# Patient Record
Sex: Female | Born: 1970 | Race: Black or African American | Hispanic: No | Marital: Single | Smoking: Current every day smoker
Health system: Southern US, Community
[De-identification: ages and names within clinical notes are randomized; demographics above are authoritative.]

## PROBLEM LIST (undated history)

## (undated) DIAGNOSIS — F329 Major depressive disorder, single episode, unspecified: Principal | ICD-10-CM

## (undated) DIAGNOSIS — G4733 Obstructive sleep apnea (adult) (pediatric): Secondary | ICD-10-CM

## (undated) HISTORY — PX: FOOT SURGERY: SHX648

## (undated) HISTORY — DX: Major depressive disorder, single episode, unspecified: F32.9

## (undated) HISTORY — DX: Obstructive sleep apnea (adult) (pediatric): G47.33

## (undated) HISTORY — PX: APPENDECTOMY: SHX54

---

## 2001-06-21 ENCOUNTER — Emergency Department (HOSPITAL_COMMUNITY): Admission: EM | Admit: 2001-06-21 | Discharge: 2001-06-21 | Payer: Self-pay

## 2002-04-30 ENCOUNTER — Encounter: Payer: Self-pay | Admitting: Orthopedic Surgery

## 2002-04-30 ENCOUNTER — Inpatient Hospital Stay (HOSPITAL_COMMUNITY): Admission: RE | Admit: 2002-04-30 | Discharge: 2002-05-01 | Payer: Self-pay | Admitting: Orthopedic Surgery

## 2003-06-02 ENCOUNTER — Emergency Department (HOSPITAL_COMMUNITY): Admission: EM | Admit: 2003-06-02 | Discharge: 2003-06-02 | Payer: Self-pay | Admitting: *Deleted

## 2003-06-02 ENCOUNTER — Encounter: Payer: Self-pay | Admitting: *Deleted

## 2003-10-15 ENCOUNTER — Emergency Department (HOSPITAL_COMMUNITY): Admission: AD | Admit: 2003-10-15 | Discharge: 2003-10-15 | Payer: Self-pay | Admitting: Family Medicine

## 2004-02-06 ENCOUNTER — Emergency Department (HOSPITAL_COMMUNITY): Admission: EM | Admit: 2004-02-06 | Discharge: 2004-02-07 | Payer: Self-pay | Admitting: Emergency Medicine

## 2004-10-13 ENCOUNTER — Emergency Department (HOSPITAL_COMMUNITY): Admission: EM | Admit: 2004-10-13 | Discharge: 2004-10-13 | Payer: Self-pay | Admitting: Family Medicine

## 2004-10-21 ENCOUNTER — Encounter: Admission: RE | Admit: 2004-10-21 | Discharge: 2004-10-21 | Payer: Self-pay | Admitting: Family Medicine

## 2004-10-23 ENCOUNTER — Encounter: Admission: RE | Admit: 2004-10-23 | Discharge: 2004-10-23 | Payer: Self-pay | Admitting: Family Medicine

## 2004-12-29 ENCOUNTER — Ambulatory Visit (HOSPITAL_COMMUNITY): Admission: RE | Admit: 2004-12-29 | Discharge: 2004-12-29 | Payer: Self-pay | Admitting: Neurology

## 2006-02-06 ENCOUNTER — Emergency Department (HOSPITAL_COMMUNITY): Admission: EM | Admit: 2006-02-06 | Discharge: 2006-02-06 | Payer: Self-pay | Admitting: Emergency Medicine

## 2007-05-23 ENCOUNTER — Emergency Department (HOSPITAL_COMMUNITY): Admission: EM | Admit: 2007-05-23 | Discharge: 2007-05-23 | Payer: Self-pay | Admitting: Family Medicine

## 2007-05-28 ENCOUNTER — Emergency Department (HOSPITAL_COMMUNITY): Admission: EM | Admit: 2007-05-28 | Discharge: 2007-05-28 | Payer: Self-pay | Admitting: Family Medicine

## 2008-01-01 ENCOUNTER — Emergency Department (HOSPITAL_COMMUNITY): Admission: EM | Admit: 2008-01-01 | Discharge: 2008-01-01 | Payer: Self-pay | Admitting: Emergency Medicine

## 2008-12-09 ENCOUNTER — Ambulatory Visit: Payer: Self-pay | Admitting: Internal Medicine

## 2011-05-04 LAB — POCT CARDIAC MARKERS
Myoglobin, poc: 46.1
Operator id: 234501
Troponin i, poc: <0.05

## 2011-05-04 LAB — DIFFERENTIAL
Eosinophils Absolute: 0.2
Lymphocytes Relative: 25
Lymphs Abs: 1.9
Monocytes Relative: 6
Neutro Abs: 5.1

## 2011-05-04 LAB — CBC
HCT: 35.3 — ABNORMAL LOW
Hemoglobin: 12.1
MCHC: 34.1
MCV: 92.5
RBC: 3.82 — ABNORMAL LOW
WBC: 7.7

## 2011-05-04 LAB — POCT I-STAT, CHEM 8
Creatinine, Ser: 0.9
HCT: 38

## 2013-04-29 ENCOUNTER — Emergency Department (HOSPITAL_COMMUNITY): Payer: 59

## 2013-04-29 ENCOUNTER — Emergency Department (HOSPITAL_COMMUNITY)
Admission: EM | Admit: 2013-04-29 | Discharge: 2013-04-29 | Disposition: A | Discharge status: Home / Self Care | Payer: 59 | Attending: Emergency Medicine | Admitting: Emergency Medicine

## 2013-04-29 ENCOUNTER — Encounter (HOSPITAL_COMMUNITY): Payer: Self-pay

## 2013-04-29 ENCOUNTER — Encounter (HOSPITAL_COMMUNITY): Payer: Self-pay | Admitting: Emergency Medicine

## 2013-04-29 ENCOUNTER — Emergency Department (HOSPITAL_COMMUNITY)
Admission: EM | Admit: 2013-04-29 | Discharge: 2013-04-29 | Disposition: A | Discharge status: Other Acute Inpatient Hospital | Payer: 59 | Source: Home / Self Care | Attending: Family Medicine | Admitting: Family Medicine

## 2013-04-29 DIAGNOSIS — R5381 Other malaise: Secondary | ICD-10-CM | POA: Insufficient documentation

## 2013-04-29 DIAGNOSIS — R600 Localized edema: Secondary | ICD-10-CM

## 2013-04-29 DIAGNOSIS — R609 Edema, unspecified: Secondary | ICD-10-CM

## 2013-04-29 DIAGNOSIS — M79609 Pain in unspecified limb: Secondary | ICD-10-CM | POA: Insufficient documentation

## 2013-04-29 DIAGNOSIS — R12 Heartburn: Secondary | ICD-10-CM | POA: Insufficient documentation

## 2013-04-29 DIAGNOSIS — R0789 Other chest pain: Secondary | ICD-10-CM

## 2013-04-29 DIAGNOSIS — Z79899 Other long term (current) drug therapy: Secondary | ICD-10-CM | POA: Insufficient documentation

## 2013-04-29 DIAGNOSIS — M7989 Other specified soft tissue disorders: Secondary | ICD-10-CM | POA: Insufficient documentation

## 2013-04-29 DIAGNOSIS — F17200 Nicotine dependence, unspecified, uncomplicated: Secondary | ICD-10-CM | POA: Insufficient documentation

## 2013-04-29 DIAGNOSIS — R079 Chest pain, unspecified: Secondary | ICD-10-CM | POA: Insufficient documentation

## 2013-04-29 DIAGNOSIS — M79605 Pain in left leg: Secondary | ICD-10-CM

## 2013-04-29 LAB — POCT I-STAT, CHEM 8
BUN: 7 mg/dL (ref 6–23)
Glucose, Bld: 91 mg/dL (ref 70–99)
Potassium: 4 mEq/L (ref 3.5–5.1)
Sodium: 138 mEq/L (ref 135–145)
TCO2: 23 mmol/L (ref 0–100)

## 2013-04-29 LAB — CBC WITH DIFFERENTIAL/PLATELET
Basophils Absolute: 0 10*3/uL (ref 0.0–0.1)
Lymphs Abs: 2.7 10*3/uL (ref 0.7–4.0)
Monocytes Absolute: 0.5 10*3/uL (ref 0.1–1.0)
Monocytes Relative: 5 % (ref 3–12)
Neutro Abs: 7.2 10*3/uL (ref 1.7–7.7)
Neutrophils Relative %: 68 % (ref 43–77)
WBC: 10.6 10*3/uL — ABNORMAL HIGH (ref 4.0–10.5)

## 2013-04-29 LAB — POCT I-STAT TROPONIN I: Troponin i, poc: 0.01 ng/mL (ref 0.00–0.08)

## 2013-04-29 MED ORDER — IOHEXOL 350 MG/ML SOLN
100.0000 mL | Freq: Once | INTRAVENOUS | Status: AC | PRN
  Administered 2013-04-29: 100 mL via INTRAVENOUS

## 2013-04-29 MED ORDER — MORPHINE SULFATE 4 MG/ML IJ SOLN
4.0000 mg | Freq: Once | INTRAMUSCULAR | Status: AC
  Administered 2013-04-29: 4 mg via INTRAVENOUS
  Filled 2013-04-29: qty 1

## 2013-04-29 MED ORDER — GI COCKTAIL ~~LOC~~
30.0000 mL | Freq: Once | ORAL | Status: AC
  Administered 2013-04-29: 30 mL via ORAL

## 2013-04-29 MED ORDER — SODIUM CHLORIDE 0.9 % IV SOLN
Freq: Once | INTRAVENOUS | Status: AC
  Administered 2013-04-29: 16:00:00 via INTRAVENOUS

## 2013-04-29 MED ORDER — ESOMEPRAZOLE MAGNESIUM 40 MG PO CPDR: 40.0000 mg | DELAYED_RELEASE_CAPSULE | Freq: Every day | ORAL | Status: DC

## 2013-04-29 MED ORDER — NITROGLYCERIN 0.4 MG SL SUBL
SUBLINGUAL_TABLET | SUBLINGUAL | Status: AC
  Filled 2013-04-29: qty 25

## 2013-04-29 MED ORDER — PANTOPRAZOLE SODIUM 40 MG IV SOLR
40.0000 mg | Freq: Once | INTRAVENOUS | Status: AC
Start: 2013-04-29 — End: 2013-04-29
  Administered 2013-04-29: 40 mg via INTRAVENOUS
  Filled 2013-04-29: qty 40

## 2013-04-29 MED ORDER — ONDANSETRON HCL 4 MG/2ML IJ SOLN
4.0000 mg | Freq: Once | INTRAMUSCULAR | Status: AC
  Administered 2013-04-29: 4 mg via INTRAVENOUS
  Filled 2013-04-29: qty 2

## 2013-04-29 MED ORDER — GI COCKTAIL ~~LOC~~
ORAL | Status: AC
  Filled 2013-04-29: qty 30

## 2013-04-29 MED ORDER — NITROGLYCERIN 0.4 MG SL SUBL
0.4000 mg | SUBLINGUAL_TABLET | SUBLINGUAL | Status: DC | PRN
  Administered 2013-04-29: 0.4 mg via SUBLINGUAL

## 2013-04-29 MED ORDER — OXYCODONE-ACETAMINOPHEN 5-325 MG PO TABS: 2.0000 | ORAL_TABLET | ORAL | Status: DC | PRN

## 2013-04-29 NOTE — ED Notes (Signed)
Works as Print production planner, sits most of the day. C/o recent swelling both legs,pitting edema noted both legs to knee,  chest pain and heaviness ( feels like there is a weight on her chest ) No PCP, no regular medicaitions

## 2013-04-29 NOTE — Progress Notes (Signed)
*  Preliminary Results* Left lower extremity venous duplex completed. Left lower extremity is negative for deep vein thrombosis. There is no evidence of left Baker's cyst.  Preliminary results discussed with Dr.Belfi.  04/29/2013 8:01 PM  Gertie Fey, RVT, RDCS, RDMS

## 2013-04-29 NOTE — ED Provider Notes (Signed)
CSN: [id number]     Arrival date & time 04/29/13  1652 History   First MD Initiated Contact with Patient 04/29/13 1700     Chief Complaint  Patient presents with  . Chest Pain   (Consider location/radiation/quality/duration/timing/severity/associated sxs/prior Treatment) HPI Comments: Patient presents with chest pain. She presented to the urgent care Center earlier this afternoon with complaints of leg swelling. She woke up this morning with some swelling of her left leg which is new for her. She also has some aching discomfort in her posterior calf and left thigh. She states this started this morning as well. She states she drove to [place] over the weekend. She denies any history of blood clots in the past. She states she also but this morning with some discomfort in her mid chest. Otherwise nonradiating. She has some heartburn symptoms as well. She has had some fatigue throughout the day. She denies any shortness of breath. She denies any pleuritic-type chest pain. She denies any history of heart problems in the past or any known past medical history. She denies a history of early heart disease in her family. She denies a history of smoking.  Patient is a 42 y.o. female presenting with chest pain.  Chest Pain Associated symptoms: fatigue   Associated symptoms: no abdominal pain, no back pain, no cough, no diaphoresis, no dizziness, no fever, no headache, no nausea, no numbness, no shortness of breath, not vomiting and no weakness     History reviewed. No pertinent past medical history. History reviewed. No pertinent past surgical history. No family history on file. History  Substance Use Topics  . Smoking status: Never Smoker   . Smokeless tobacco: Not on file  . Alcohol Use: No   OB History   Grav Para Term Preterm Abortions TAB SAB Ect Mult Living                 Review of Systems  Constitutional: Positive for fatigue. Negative for fever, chills and diaphoresis.  HENT: Negative for  congestion, rhinorrhea and sneezing.   Eyes: Negative.   Respiratory: Negative for cough, chest tightness and shortness of breath.   Cardiovascular: Positive for chest pain and leg swelling.  Gastrointestinal: Negative for nausea, vomiting, abdominal pain, diarrhea and blood in stool.  Genitourinary: Negative for frequency, hematuria, flank pain and difficulty urinating.  Musculoskeletal: Negative for back pain and arthralgias.  Skin: Negative for rash.  Neurological: Negative for dizziness, speech difficulty, weakness, numbness and headaches.    Allergies  Review of patient's allergies indicates no known allergies.  Home Medications   Current Outpatient Rx  Name  Route  Sig  Dispense  Refill  . esomeprazole (NEXIUM) 40 MG capsule   Oral   Take 1 capsule (40 mg total) by mouth daily.   30 capsule   0   . oxyCODONE-acetaminophen (PERCOCET) 5-325 MG per tablet   Oral   Take 2 tablets by mouth every 4 (four) hours as needed for pain.   20 tablet   0    BP 117/68  Temp(Src) 99 F (37.2 C) (Oral)  Resp 18  SpO2 100%  LMP 04/15/2013 Physical Exam  Constitutional: She is oriented to person, place, and time. She appears well-developed and well-nourished.  HENT:  Head: Normocephalic and atraumatic.  Mouth/Throat: Oropharynx is clear and moist.  Eyes: Pupils are equal, round, and reactive to light.  Neck: Normal range of motion. Neck supple.  Cardiovascular: Normal rate, regular rhythm and normal heart sounds.  Pulmonary/Chest: Effort normal and breath sounds normal. No respiratory distress. She has no wheezes. She has no rales. She exhibits tenderness (Positive reproducible tenderness over the sternum).  Abdominal: Soft. Bowel sounds are normal. There is no tenderness. There is no rebound and no guarding.  Musculoskeletal: Normal range of motion. She exhibits edema and tenderness.  Patient has some mild pain edema to the right lower leg which patient says is at baseline. She  also has some mild pitting edema to the left lower legs up to the knee which she says is new since this morning. She also has some mild tenderness along her posterior calf. Pedal pulses are intact and symmetric  Lymphadenopathy:    She has no cervical adenopathy.  Neurological: She is alert and oriented to person, place, and time.  Skin: Skin is warm and dry. No rash noted.  Psychiatric: She has a normal mood and affect.    ED Course  Procedures (including critical care time) Labs Review Labs Reviewed  CBC WITH DIFFERENTIAL - Abnormal; Notable for the following:    WBC 10.6 (*)    All other components within normal limits  POCT I-STAT TROPONIN I   Imaging Review Ct Angio Chest Pe W/cm &/or Wo Cm  04/29/2013   CLINICAL DATA:  Leg swelling. Chest pain. Chest heaviness. Pulmonary embolism.  EXAM: CT ANGIOGRAPHY CHEST WITH CONTRAST  TECHNIQUE: Multidetector CT imaging of the chest was performed using the standard protocol during bolus administration of intravenous contrast. Multiplanar CT image reconstructions including MIPs were obtained to evaluate the vascular anatomy.  CONTRAST:  OMNIPAQUE IOHEXOL 350 MG/ML SOLN  COMPARISON:  01/01/2008.  FINDINGS: There is bolus dispersion on the examination. The study is adequate for evaluation of pulmonary embolus and no pulmonary embolism is present. Prominent residual thymic tissue is present in the anterior mediastinum which may represent thymic hyperplasia. Aorta and branch vessels appear within normal limits. No effusion. Heart grossly appears within normal limits. Incidental imaging of the upper abdomen is within normal limits. No adenopathy. Bones appear within normal limits. Scattered areas of ground-glass attenuation are present in the lung, most compatible with air trapping. Central airways are patent.  Review of the MIP images confirms the above findings.  IMPRESSION: Technically adequate study without pulmonary embolism. No acute  cardiopulmonary disease.   Electronically Signed   By: Andreas Newport M.D.   On: 04/29/2013 18:35     Date: 04/29/2013  Rate: 69  Rhythm: normal sinus rhythm  QRS Axis: normal  Intervals: normal  ST/T Wave abnormalities: nonspecific T wave changes  Conduction Disutrbances:none  Narrative Interpretation:   Old EKG Reviewed: unchanged  From EKG on same day, no prior EKGs   MDM   1. Chest pain   2. Leg pain, left    Patient presents with chest pain and left leg pain. She does have some symptoms suggestive of reflux with complaints of burning up into her chest. Chills he describes as a tightness. She got no relief after a GI cocktail and nitroglycerin which was given prior to arrival. She has no evidence of pulmonary embolus or left leg DVT. She has no evidence of pulmonary edema. Her EKG did not show any significant ischemic changes and her troponins negative. Her pain is also somewhat reproducible on exam with palpation. Her pain had started on waking up this morning. I have a low suspicion of acute coronary syndrome. She has no other known risk factors for early heart disease. She's currently chest pain-free. She  did seem to get better after a dose of morphine and some Protonix. I feel this point she can be discharged with close followup. [facility] and I advised her: The morning to be seen within the next couple of days. I advised to return here if she has worsening chest pain shortness of breath or other worsening symptoms.    Rolan Bucco, MD 04/29/13 2013

## 2013-04-29 NOTE — ED Notes (Signed)
Patient states she has had no change in her discomfort since her GI coctail was given, Dr Artis Flock aware; CN Martie Lee advised of pending transfer via [facility]; [facility] called to transport ASAP to main ED

## 2013-04-29 NOTE — ED Notes (Signed)
Pt brought to ED by [facility] from urgent care with chest pain radiating to back  And no SOB.pt recived nitroX1 by EMS but still in pain and asiprin???not given.

## 2013-04-29 NOTE — ED Provider Notes (Signed)
CSN: [id number]     Arrival date & time 04/29/13  1348 History   First MD Initiated Contact with Patient 04/29/13 1415     Chief Complaint  Patient presents with  . Weakness   (Consider location/radiation/quality/duration/timing/severity/associated sxs/prior Treatment) Patient is a 42 y.o. female presenting with weakness. The history is provided by the patient.  Weakness This is a new problem. The current episode started 6 to 12 hours ago. The problem has been gradually worsening. Associated symptoms include chest pain. Pertinent negatives include no shortness of breath. Associated symptoms comments: Burning like fire in chest, no h/o gerd, ankles swelling this am, no n/v. Feels like shirt is getting tighter.    History reviewed. No pertinent past medical history. History reviewed. No pertinent past surgical history. History reviewed. No pertinent family history. History  Substance Use Topics  . Smoking status: Never Smoker   . Smokeless tobacco: Not on file  . Alcohol Use: No   OB History   Grav Para Term Preterm Abortions TAB SAB Ect Mult Living                 Review of Systems  Respiratory: Positive for chest tightness. Negative for shortness of breath and wheezing.   Cardiovascular: Positive for chest pain and leg swelling. Negative for palpitations.  Neurological: Positive for weakness.    Allergies  Review of patient's allergies indicates no known allergies.  Home Medications   Current Outpatient Rx  Name  Route  Sig  Dispense  Refill  . esomeprazole (NEXIUM) 40 MG capsule   Oral   Take 1 capsule (40 mg total) by mouth daily.   30 capsule   0   . oxyCODONE-acetaminophen (PERCOCET) 5-325 MG per tablet   Oral   Take 2 tablets by mouth every 4 (four) hours as needed for pain.   20 tablet   0    BP 139/84  Pulse 76  Temp(Src) 98.6 F (37 C) (Oral)  Resp 18  SpO2 100%  LMP 04/15/2013 Physical Exam  Nursing note and vitals reviewed. Constitutional: She  is oriented to person, place, and time. She appears well-developed and well-nourished.  HENT:  Head: Normocephalic.  Mouth/Throat: Oropharynx is clear and moist.  Neck: Normal range of motion. Neck supple.  Cardiovascular: Normal rate, regular rhythm, normal heart sounds and intact distal pulses.   Pulmonary/Chest: Effort normal and breath sounds normal. She exhibits tenderness.  Abdominal: Soft. Normal appearance and bowel sounds are normal. She exhibits no distension and no mass. There is tenderness in the epigastric area. There is no rebound and no guarding.  Musculoskeletal: She exhibits edema.  Lymphadenopathy:    She has no cervical adenopathy.  Neurological: She is alert and oriented to person, place, and time.  Skin: Skin is warm and dry.    ED Course  Procedures (including critical care time) Labs Review Labs Reviewed  POCT I-STAT, CHEM 8   Imaging Review Ct Angio Chest Pe W/cm &/or Wo Cm  04/29/2013   CLINICAL DATA:  Leg swelling. Chest pain. Chest heaviness. Pulmonary embolism.  EXAM: CT ANGIOGRAPHY CHEST WITH CONTRAST  TECHNIQUE: Multidetector CT imaging of the chest was performed using the standard protocol during bolus administration of intravenous contrast. Multiplanar CT image reconstructions including MIPs were obtained to evaluate the vascular anatomy.  CONTRAST:  OMNIPAQUE IOHEXOL 350 MG/ML SOLN  COMPARISON:  01/01/2008.  FINDINGS: There is bolus dispersion on the examination. The study is adequate for evaluation of pulmonary embolus and no pulmonary  embolism is present. Prominent residual thymic tissue is present in the anterior mediastinum which may represent thymic hyperplasia. Aorta and branch vessels appear within normal limits. No effusion. Heart grossly appears within normal limits. Incidental imaging of the upper abdomen is within normal limits. No adenopathy. Bones appear within normal limits. Scattered areas of ground-glass attenuation are present in the  lung, most compatible with air trapping. Central airways are patent.  Review of the MIP images confirms the above findings.  IMPRESSION: Technically adequate study without pulmonary embolism. No acute cardiopulmonary disease.   Electronically Signed   By: Andreas Newport M.D.   On: 04/29/2013 18:35    MDM  ecg-- per dr cooper-no acute changes. No change in sx with gi cocktail  Sent for further eval of atypical chest pain, leg swelling and low back pain--all new sx with no prior hx.    Linna Hoff, MD 04/29/13 2122

## 2013-05-01 ENCOUNTER — Encounter (HOSPITAL_COMMUNITY): Payer: Self-pay | Admitting: Emergency Medicine

## 2013-05-01 ENCOUNTER — Emergency Department (HOSPITAL_COMMUNITY)
Admission: EM | Admit: 2013-05-01 | Discharge: 2013-05-01 | Disposition: A | Discharge status: Home / Self Care | Payer: 59 | Attending: Emergency Medicine | Admitting: Emergency Medicine

## 2013-05-01 DIAGNOSIS — T4995XA Adverse effect of unspecified topical agent, initial encounter: Secondary | ICD-10-CM | POA: Insufficient documentation

## 2013-05-01 DIAGNOSIS — T7840XA Allergy, unspecified, initial encounter: Secondary | ICD-10-CM | POA: Insufficient documentation

## 2013-05-01 MED ORDER — ACETAMINOPHEN 325 MG PO TABS
650.0000 mg | ORAL_TABLET | Freq: Once | ORAL | Status: AC
  Administered 2013-05-01: 650 mg via ORAL
  Filled 2013-05-01: qty 2

## 2013-05-01 MED ORDER — FAMOTIDINE 20 MG PO TABS
20.0000 mg | ORAL_TABLET | Freq: Once | ORAL | Status: AC
  Administered 2013-05-01: 20 mg via ORAL
  Filled 2013-05-01: qty 1

## 2013-05-01 MED ORDER — FAMOTIDINE 20 MG PO TABS: 20.0000 mg | ORAL_TABLET | Freq: Two times a day (BID) | ORAL | Status: DC

## 2013-05-01 MED ORDER — DIPHENHYDRAMINE HCL 25 MG PO CAPS
50.0000 mg | ORAL_CAPSULE | Freq: Once | ORAL | Status: AC
  Administered 2013-05-01: 50 mg via ORAL
  Filled 2013-05-01: qty 2

## 2013-05-01 MED ORDER — PREDNISONE 20 MG PO TABS
60.0000 mg | ORAL_TABLET | Freq: Once | ORAL | Status: AC
  Administered 2013-05-01: 60 mg via ORAL
  Filled 2013-05-01: qty 3

## 2013-05-01 MED ORDER — DIPHENHYDRAMINE HCL 25 MG PO TABS: 25.0000 mg | ORAL_TABLET | Freq: Four times a day (QID) | ORAL | Status: DC

## 2013-05-01 MED ORDER — EPINEPHRINE 0.3 MG/0.3ML IJ SOAJ
0.3000 mg | Freq: Once | INTRAMUSCULAR | Status: AC
  Administered 2013-05-01: 0.3 mg via SUBCUTANEOUS
  Filled 2013-05-01: qty 0.3

## 2013-05-01 NOTE — ED Provider Notes (Signed)
Medical screening examination/treatment/procedure(s) were conducted as a shared visit with non-physician practitioner(s) and myself.  I personally evaluated the patient during the encounter Patient presents with subjective facial swelling starting this morning. She she's been using oxycodone Nexium prescribed from her previous ED visit. She is no new exposures or medications. Patient has no oropharyngeal swelling. I do not appreciate any obvious facial swelling. Patient's lungs are clear no evidence of any stridor. She has been treated for possible allergic reaction though I think this is probably more related to seasonal allergies. She is observed in the emergency department without worsening symptoms.   Loren Racer, MD 05/01/13 [phone]

## 2013-05-01 NOTE — ED Notes (Signed)
PA at bedside.

## 2013-05-01 NOTE — ED Notes (Signed)
Pt from home, c/o generlized swelling. Pt was seen on Mon for bilateral feet edema. Pt states feet swelling decrease, states feeling tight all over.

## 2013-05-01 NOTE — ED Provider Notes (Signed)
CSN: [id number]     Arrival date & time 05/01/13  4098 History   First MD Initiated Contact with Patient 05/01/13 [phone]     Chief Complaint  Patient presents with  . Facial Swelling   (Consider location/radiation/quality/duration/timing/severity/associated sxs/prior Treatment) HPI  42 year old female with no significant past medical history presents for evaluations of facial edema. Patient states 2 days ago she was evaluated in the ER for left lower extremity edema. She had a Doppler ultrasound along with chest CT angiogram which showed no evidence of PE or DVT. She was discharged with oxycodone and Nexium. States she took medication as prescribed. This morning she woke up noticing sensation of facial swelling along with eyelids swelling, and mild rash, and a itchy and burning sensation throughout the back. Symptom has been persistent. No specific treatment tried. Denies any other environmental changes including no other recent medication change, soap, detergent, new pets. States she has had Nexium and Percocet in the past with no problem. Denies eating any other exotic foods or any recent travel. No history of asthma. Patient is a nonsmoker. Currently denies any fever, chills, tongue swelling, throat swelling, difficulty breathing, chest pain, shortness of breath, abdominal pain, nausea, vomiting, diarrhea, numbness or weakness although she does complaining of "feeling tight all over".  Denies any recent sickness.  History reviewed. No pertinent past medical history. Past Surgical History  Procedure Laterality Date  . Appendectomy     History reviewed. No pertinent family history. History  Substance Use Topics  . Smoking status: Never Smoker   . Smokeless tobacco: Not on file  . Alcohol Use: No   OB History   Grav Para Term Preterm Abortions TAB SAB Ect Mult Living                 Review of Systems  All other systems reviewed and are negative.    Allergies  Review of patient's  allergies indicates no known allergies.  Home Medications   Current Outpatient Rx  Name  Route  Sig  Dispense  Refill  . esomeprazole (NEXIUM) 40 MG capsule   Oral   Take 1 capsule (40 mg total) by mouth daily.   30 capsule   0   . oxyCODONE-acetaminophen (PERCOCET) 5-325 MG per tablet   Oral   Take 2 tablets by mouth every 4 (four) hours as needed for pain.   20 tablet   0    BP 123/93  Pulse 76  Temp(Src) 98.6 F (37 C) (Oral)  Resp 18  Ht 5\' 7"  (1.702 m)  Wt 170 lb (77.111 kg)  BMI 26.62 kg/m2  SpO2 100%  LMP 04/15/2013 Physical Exam  Nursing note and vitals reviewed. Constitutional: She is oriented to person, place, and time. She appears well-developed and well-nourished. No distress.  Awake, alert, nontoxic appearance  HENT:  Head: Atraumatic.  Mouth/Throat: Oropharynx is clear and moist.  No significant tongue swelling or airway compromise  Mild palbebral edema bilat without ptosis or vision changes.    Eyes: Conjunctivae are normal. Right eye exhibits no discharge. Left eye exhibits no discharge.  Neck: Neck supple.  Cardiovascular: Normal rate and regular rhythm.   Pulmonary/Chest: Effort normal. No respiratory distress. She exhibits no tenderness.  Abdominal: Soft. There is no tenderness. There is no rebound.  Musculoskeletal: She exhibits no tenderness.  ROM appears intact, no obvious focal weakness  Neurological: She is alert and oriented to person, place, and time.  Mental status and motor strength appears intact  Skin:  No rash (a fine faint maculopapular rash noted throughout face, upper chest, and BUE without petechiae/vesicular/pustular lesions.) noted.  Psychiatric: She has a normal mood and affect.    ED Course  Procedures (including critical care time)  9:32 AM Pt with sxs suggestive of allergic reaction.  NO airway compromised.  NO red flags rash.  Difficult to pin point the source of allergy.  Pt did recently have IV contrast dye, Percocet  and Nexium.  Will give prednisone/benadryl/pepcid combo and will monitor.    10:45 AM On recheck no worsening of symptoms. Patient state itchiness has improved. Request Tylenol for headache. Tylenol given.  11:22 AM On reevaluation pt report mild throat tightening sensation.  No wheezes on exam, sats 97% on RA, difficult to assess mouth as pt unable to open mouth fully, but sts this is normal for her.  Pt denies tongue swelling.  Care discussed with attending who evaluate pt and felt her airway is intact.  However, recommend epi 0.3mg  subQ.  Will continue to monitor.  Pt currently in NAD.    12:14 PM Patient has been monitored for more than 3 hours without any objective worsening of symptoms. She is currently stable for discharge. Return precautions discussed. Patient will followup with her PCP on Friday.  Labs Review Labs Reviewed - No data to display Imaging Review Ct Angio Chest Pe W/cm &/or Wo Cm  04/29/2013   CLINICAL DATA:  Leg swelling. Chest pain. Chest heaviness. Pulmonary embolism.  EXAM: CT ANGIOGRAPHY CHEST WITH CONTRAST  TECHNIQUE: Multidetector CT imaging of the chest was performed using the standard protocol during bolus administration of intravenous contrast. Multiplanar CT image reconstructions including MIPs were obtained to evaluate the vascular anatomy.  CONTRAST:  OMNIPAQUE IOHEXOL 350 MG/ML SOLN  COMPARISON:  01/01/2008.  FINDINGS: There is bolus dispersion on the examination. The study is adequate for evaluation of pulmonary embolus and no pulmonary embolism is present. Prominent residual thymic tissue is present in the anterior mediastinum which may represent thymic hyperplasia. Aorta and branch vessels appear within normal limits. No effusion. Heart grossly appears within normal limits. Incidental imaging of the upper abdomen is within normal limits. No adenopathy. Bones appear within normal limits. Scattered areas of ground-glass attenuation are present in the lung,  most compatible with air trapping. Central airways are patent.  Review of the MIP images confirms the above findings.  IMPRESSION: Technically adequate study without pulmonary embolism. No acute cardiopulmonary disease.   Electronically Signed   By: Andreas Newport M.D.   On: 04/29/2013 18:35    MDM   1. Allergic reaction, initial encounter    BP 123/93  Pulse 76  Temp(Src) 98.6 F (37 C) (Oral)  Resp 18  Ht 5\' 7"  (1.702 m)  Wt 170 lb (77.111 kg)  BMI 26.62 kg/m2  SpO2 100%  LMP 04/15/2013     Fayrene Helper, PA-C 05/01/13 1216

## 2013-05-03 ENCOUNTER — Ambulatory Visit (INDEPENDENT_AMBULATORY_CARE_PROVIDER_SITE_OTHER): Payer: 59 | Admitting: Family Medicine

## 2013-05-03 ENCOUNTER — Encounter: Payer: Self-pay | Admitting: Family Medicine

## 2013-05-03 ENCOUNTER — Other Ambulatory Visit: Payer: Self-pay

## 2013-05-03 VITALS — BP 126/84 | Temp 98.3°F | Ht 68.0 in | Wt 283.0 lb

## 2013-05-03 DIAGNOSIS — T7840XA Allergy, unspecified, initial encounter: Secondary | ICD-10-CM

## 2013-05-03 DIAGNOSIS — Z7189 Other specified counseling: Secondary | ICD-10-CM

## 2013-05-03 DIAGNOSIS — Z1231 Encounter for screening mammogram for malignant neoplasm of breast: Secondary | ICD-10-CM

## 2013-05-03 DIAGNOSIS — K219 Gastro-esophageal reflux disease without esophagitis: Secondary | ICD-10-CM

## 2013-05-03 DIAGNOSIS — Z7689 Persons encountering health services in other specified circumstances: Secondary | ICD-10-CM

## 2013-05-03 LAB — LIPID PANEL
HDL: 49.7 mg/dL (ref >39.00)
LDL Cholesterol: 129 mg/dL — ABNORMAL HIGH (ref 0–99)
Total CHOL/HDL Ratio: 4
Triglycerides: 84 mg/dL (ref 0.0–149.0)
VLDL: 16.8 mg/dL (ref 0.0–40.0)

## 2013-05-03 NOTE — Progress Notes (Signed)
Chief Complaint  Patient presents with  . Establish Care  . Hypertension  . Allergic Reaction    needs epi pen     HPI:  Heidi Goodman is here to establish care.  Last PCP and physical: has been > 5 years,   Has the following chronic problems and concerns today:   Elevated BP: -elevated at urgent care a few days ago when seen for fatigue, atypical CP and LE edema -had extensive evaluation with LE dopplers, CT angio, EKGs, cbc, troponins all normal -felt better after protonix and sent home on this and thought to be GERD  Hx Allergic Reaction: -had some facial swelling and itching on 9/24 and seen in ED -unsure what reaction was to as has had many new medications (prilosec, nexium nitro in ED, hydrocodone, contrast dye and flu shot) in last several days -currently swelling resolved, no SOB, no throat swelling, no CP -a little itching and taking pepcid  Tobacco use: -less then 1 pack per week -not very interested in quitting There are no active problems to display for this patient.   Health Maintenance:  ROS: See pertinent positives and negatives per HPI.  No past medical history on file.  Family History  Problem Relation Age of Onset  . Hypertension Mother   . Hyperlipidemia Mother   . Heart disease Mother 33    CAD, MI  . Hypertension Maternal Grandmother     History   Social History  . Marital Status: Single    Spouse Name: N/A    Number of Children: N/A  . Years of Education: N/A   Social History Main Topics  . Smoking status: Current Every Day Smoker  . Smokeless tobacco: None     Comment: 1 pack per week  . Alcohol Use: No  . Drug Use: No  . Sexual Activity: None   Other Topics Concern  . 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      Home Situation: lives with husband, daughter and grandchildren      Spiritual Beliefs: Baptist      Lifestyle: no regular CV; poor diet             Current  outpatient prescriptions:diphenhydrAMINE (BENADRYL) 25 MG tablet, Take 1 tablet (25 mg total) by mouth every 6 (six) hours., Disp: 20 tablet, Rfl: 0;  esomeprazole (NEXIUM) 40 MG capsule, Take 1 capsule (40 mg total) by mouth daily., Disp: 30 capsule, Rfl: 0;  famotidine (PEPCID) 20 MG tablet, Take 1 tablet (20 mg total) by mouth 2 (two) times daily., Disp: 30 tablet, Rfl: 0 oxyCODONE-acetaminophen (PERCOCET) 5-325 MG per tablet, Take 2 tablets by mouth every 4 (four) hours as needed for pain., Disp: 20 tablet, Rfl: 0  EXAM:  Filed Vitals:   05/03/13 0919  BP: 126/84  Temp: 98.3 F (36.8 C)    Body mass index is 43.04 kg/(m^2).  GENERAL: vitals reviewed and listed above, alert, oriented, appears well hydrated and in no acute distress  HEENT: atraumatic, conjunttiva clear, no obvious abnormalities on inspection of external nose and ears  NECK: no obvious masses on inspection  LUNGS: clear to auscultation bilaterally, no wheezes, rales or rhonchi, good air movement  CV: HRRR, no peripheral edema  MS: moves all extremities without noticeable abnormality  PSYCH: pleasant and cooperative, no obvious depression or anxiety  ASSESSMENT AND PLAN:  Discussed the following assessment and plan:  Encounter to establish care - Plan: Hemoglobin  A1c, Lipid panel  Allergic reaction, initial encounter - Plan: Ambulatory referral to Allergy  GERD (gastroesophageal reflux disease)  -We reviewed the PMH, PSH, FH, SH, Meds and Allergies. -We provided refills for any medications we will prescribe as needed. -We addressed current concerns per orders and patient instructions. -We have asked for records for pertinent exams, studies, vaccines and notes from previous providers. -We have advised patient to follow up per instructions below. -FASTING LABS today -referred to allergy for her recent rx - ED/emergent precautions -return for CPE -smoking cessation counseling and quit line info given -BP  fine today   -Patient advised to return or notify a doctor immediately if symptoms worsen or persist or new concerns arise.  Patient Instructions  -Stop the nexium and the percocet  -continue pepcid and zyrtec  -We placed a referral for you as discussed to the allergist. It usually takes about 1-2 weeks to process and schedule this referral. If you have not heard from [place] regarding this appointment in 2 weeks please contact our office.  -go to emergency room immediately if recurrent swelling, trouble breathing, or other worrisome concerns  -schedule mammogram  -We have ordered labs or studies at this visit. It can take up to 1-2 weeks for results and processing. We will contact you with instructions IF your results are abnormal. Normal results will be released to your [facility]. If you have not heard from [place] or can not find your results in [facility] in 2 weeks please contact our office.  -PLEASE SIGN UP FOR MYCHART TODAY   We recommend the following healthy lifestyle measures: - eat a healthy diet consisting of lots of vegetables, fruits, beans, nuts, seeds, healthy meats such as white chicken and fish and whole grains.  - avoid fried foods, fast food, processed foods, sodas, red meet and other fattening foods.  - get a least 150 minutes of aerobic exercise per week.   -follow up in the end of November or beginning of December for physical exam with pap      Kriste Basque R.

## 2013-05-03 NOTE — Progress Notes (Signed)
Quick Note:  Left a message for return call. ______ 

## 2013-05-03 NOTE — Patient Instructions (Addendum)
-  Stop the nexium and the percocet  -continue pepcid and zyrtec  -We placed a referral for you as discussed to the allergist. It usually takes about 1-2 weeks to process and schedule this referral. If you have not heard from [place] regarding this appointment in 2 weeks please contact our office.  -go to emergency room immediately if recurrent swelling, trouble breathing, or other worrisome concerns  -schedule mammogram  -We have ordered labs or studies at this visit. It can take up to 1-2 weeks for results and processing. We will contact you with instructions IF your results are abnormal. Normal results will be released to your [facility]. If you have not heard from [place] or can not find your results in [facility] in 2 weeks please contact our office.  -PLEASE SIGN UP FOR MYCHART TODAY   We recommend the following healthy lifestyle measures: - eat a healthy diet consisting of lots of vegetables, fruits, beans, nuts, seeds, healthy meats such as white chicken and fish and whole grains.  - avoid fried foods, fast food, processed foods, sodas, red meet and other fattening foods.  - get a least 150 minutes of aerobic exercise per week.   -follow up in the end of November or beginning of December for physical exam with pap

## 2013-05-07 NOTE — Progress Notes (Signed)
Quick Note:    Released to mychart.

## 2013-05-10 ENCOUNTER — Encounter: Payer: Self-pay | Admitting: Family Medicine

## 2013-05-10 NOTE — Patient Instructions (Signed)
Received office notes from  Allergy from 05/06/2013.  Put in Dr. Elmyra Ricks basket for signature and sent to scan.

## 2013-05-23 ENCOUNTER — Ambulatory Visit
Admission: RE | Admit: 2013-05-23 | Discharge: 2013-05-23 | Disposition: A | Discharge status: Home / Self Care | Payer: 59 | Source: Ambulatory Visit

## 2013-05-23 DIAGNOSIS — Z1231 Encounter for screening mammogram for malignant neoplasm of breast: Secondary | ICD-10-CM

## 2013-07-03 ENCOUNTER — Encounter: Payer: 59 | Admitting: Family Medicine

## 2013-07-12 ENCOUNTER — Ambulatory Visit (INDEPENDENT_AMBULATORY_CARE_PROVIDER_SITE_OTHER): Payer: 59 | Admitting: Family Medicine

## 2013-07-12 VITALS — BP 100/68 | Temp 99.1°F

## 2013-07-12 DIAGNOSIS — Z0000 Encounter for general adult medical examination without abnormal findings: Secondary | ICD-10-CM

## 2013-07-12 DIAGNOSIS — E785 Hyperlipidemia, unspecified: Secondary | ICD-10-CM

## 2013-07-12 DIAGNOSIS — F329 Major depressive disorder, single episode, unspecified: Secondary | ICD-10-CM

## 2013-07-12 DIAGNOSIS — E669 Obesity, unspecified: Secondary | ICD-10-CM

## 2013-07-12 DIAGNOSIS — Z23 Encounter for immunization: Secondary | ICD-10-CM

## 2013-07-12 MED ORDER — ESCITALOPRAM OXALATE 10 MG PO TABS: 10.0000 mg | ORAL_TABLET | Freq: Every day | ORAL | Status: DC

## 2013-07-12 NOTE — Addendum Note (Signed)
Addended by: Azucena Freed on: 07/12/2013 08:53 AM   Modules accepted: Orders

## 2013-07-12 NOTE — Addendum Note (Signed)
Addended by: Terressa Koyanagi on: 07/12/2013 10:37 AM   Modules accepted: Level of Service

## 2013-07-12 NOTE — Progress Notes (Signed)
Pre visit review using our clinic review tool, if applicable. No additional management support is needed unless otherwise documented below in the visit note. 

## 2013-07-12 NOTE — Patient Instructions (Signed)
-  Please schedule appointment with gynecologist for pap smear and your concerns regarding tubal ligation and fibroids  -take vitamin D 1000 IU D3 daily and 1200 mg calcium daily (total from diet and supplement)  -start the lexapro - take every day and follow up in in 4-6 weeks  -schedule counseling  We recommend the following healthy lifestyle measures: - eat a healthy diet consisting of lots of vegetables, fruits, beans, nuts, seeds, healthy meats such as white chicken and fish and whole grains.  - avoid fried foods, fast food, processed foods, sodas, red meet and other fattening foods.  - get a least 150 minutes of aerobic exercise per week.

## 2013-07-12 NOTE — Progress Notes (Addendum)
Chief Complaint  Patient presents with  . Annual Exam    HPI:  Here for CPE:  -Concerns today: none  Depression: -started several months -depressed mood and irritable several days per week, cries -no thoughts of self harm or hopelessness -some anhedonia, feels overwhelmed at time -has good support, does talk with pastor  Want tubal lig:  -Diet: diet is poor, larger portion sizes  -Taking folic acid, vit D or calcium: no  -Exercise: no regular exercise  -Diabetes and Dyslipidemia Screening: done 04/2013, mild dyslipidemia - lifestyle recs advised  -Hx of HTN: no  -Vaccines: UTD Wants tdap  -pap history: can't remember last pap, reports abnormal in the past  -FDLMP: 1 month, usually monthly  -sexual activity: yes, female partner, no new partners  -wants STI testing: no  -FH breast, colon or ovarian ca: see FH -had mammo - repeats in 1 year -will get pap with gyn whom she is going to see for ? Fibroids and tubal ligation  -Alcohol, Tobacco, drug use: see social history  Review of Systems - Review of Systems  Constitutional: Negative for fever, chills and malaise/fatigue.  HENT: Negative for hearing loss.   Eyes: Negative for blurred vision.  Cardiovascular: Negative for chest pain and leg swelling.  Gastrointestinal: Negative for diarrhea, constipation and blood in stool.  Genitourinary: Negative for dysuria.  Musculoskeletal: Negative for falls.  Neurological: Negative for dizziness.  Endo/Heme/Allergies: Does not bruise/bleed easily.  Psychiatric/Behavioral: Positive for depression. Negative for suicidal ideas and memory loss. The patient is nervous/anxious.      No past medical history on file.  Past Surgical History  Procedure Laterality Date  . Appendectomy    . Foot surgery      Family History  Problem Relation Age of Onset  . Hypertension Mother   . Hyperlipidemia Mother   . Heart disease Mother 78    CAD, MI  . Hypertension Maternal  Grandmother     History   Social History  . Marital Status: Single    Spouse Name: N/A    Number of Children: N/A  . Years of Education: N/A   Social History Main Topics  . Smoking status: Current Every Day Smoker  . Smokeless tobacco: Not on file     Comment: 1 pack per week  . Alcohol Use: No  . Drug Use: No  . Sexual Activity: Not on file   Other Topics Concern  . 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      Home Situation: lives with husband, daughter and grandchildren      Spiritual Beliefs: Baptist      Lifestyle: no regular CV; poor diet             Current outpatient prescriptions:cetirizine (ZYRTEC) 10 MG tablet, Take 10 mg by mouth daily., Disp: , Rfl: ;  esomeprazole (NEXIUM) 40 MG capsule, Take 1 capsule (40 mg total) by mouth daily., Disp: 30 capsule, Rfl: 0;  mometasone (ELOCON) 0.1 % cream, , Disp: , Rfl: ;  diphenhydrAMINE (BENADRYL) 25 MG tablet, Take 1 tablet (25 mg total) by mouth every 6 (six) hours., Disp: 20 tablet, Rfl: 0 escitalopram (LEXAPRO) 10 MG tablet, Take 1 tablet (10 mg total) by mouth daily., Disp: 30 tablet, Rfl: 0  EXAM:  Filed Vitals:   07/12/13 0806  BP: 100/68  Temp: 99.1 F (37.3 C)    GENERAL: vitals reviewed and listed below, alert, oriented, appears  well hydrated and in no acute distress  HEENT: head atraumatic, PERRLA, normal appearance of eyes, ears, nose and mouth. moist mucus membranes.  NECK: supple, no masses or lymphadenopathy  LUNGS: clear to auscultation bilaterally, no rales, rhonchi or wheeze  CV: HRRR, no peripheral edema or cyanosis, normal pedal pulses  BREAST: normal appearance - no lesions or discharge, on palpation normal breast tissue without any suspicious masses  ABDOMEN: bowel sounds normal, soft, non tender to palpation, no masses, no rebound or guarding  GU: deferred  RECTAL: refused  SKIN: no rash or abnormal lesions  MS: normal gait,  moves all extremities normally  NEURO: CN II-XII grossly intact, normal muscle strength and sensation to light touch on extremities  PSYCH: normal affect, pleasant and cooperative  ASSESSMENT AND PLAN:  Discussed the following assessment and plan:  Visit for preventive health examination  Depression - Plan: escitalopram (LEXAPRO) 10 MG tablet  Other and unspecified hyperlipidemia  Obesity, unspecified  Need for prophylactic vaccination with combined diphtheria-tetanus-pertussis (DTP) vaccine - Plan: Tdap vaccine greater than or equal to 7yo IM   -Discussed and advised all [place] preventive services health task force level A and B recommendations for age, sex and risks.  -Advised at least 150 minutes of exercise per week and a healthy diet low in saturated fats and sweets and consisting of fresh fruits and vegetables, lean meats such as fish and white chicken and whole grains.  -labs, studies and vaccines per orders this encounter  -tdap today  -she will see gyn for pap and gyn issues  Orders Placed This Encounter  Procedures  . Tdap vaccine greater than or equal to 7yo IM    Patient Instructions  -Please schedule appointment with gynecologist for pap smear and your concerns regarding tubal ligation and fibroids  -take vitamin D 1000 IU D3 daily and 1200 mg calcium daily (total from diet and supplement)  -start the lexapro - take every day and follow up in in 4-6 weeks  -schedule counseling  We recommend the following healthy lifestyle measures: - eat a healthy diet consisting of lots of vegetables, fruits, beans, nuts, seeds, healthy meats such as white chicken and fish and whole grains.  - avoid fried foods, fast food, processed foods, sodas, red meet and other fattening foods.  - get a least 150 minutes of aerobic exercise per week.      Patient advised to return to clinic immediately if symptoms worsen or persist or new concerns.   Return in about 5 weeks  (around 08/16/2013) for depression.  Kriste Basque R.

## 2013-08-09 ENCOUNTER — Encounter: Payer: Self-pay | Admitting: Family Medicine

## 2013-08-09 ENCOUNTER — Ambulatory Visit (INDEPENDENT_AMBULATORY_CARE_PROVIDER_SITE_OTHER): Payer: 59 | Admitting: Family Medicine

## 2013-08-09 VITALS — BP 104/76 | Temp 98.4°F | Wt 283.0 lb

## 2013-08-09 DIAGNOSIS — F329 Major depressive disorder, single episode, unspecified: Secondary | ICD-10-CM

## 2013-08-09 DIAGNOSIS — F3289 Other specified depressive episodes: Secondary | ICD-10-CM

## 2013-08-09 DIAGNOSIS — F32A Depression, unspecified: Secondary | ICD-10-CM

## 2013-08-09 MED ORDER — ESCITALOPRAM OXALATE 10 MG PO TABS: 10.0000 mg | ORAL_TABLET | Freq: Every day | ORAL | Status: DC

## 2013-08-09 NOTE — Progress Notes (Signed)
Chief Complaint  Patient presents with  . 4 to 6 week follow up    HPI:  Follow up:  Depression: -advised counseling and started lexapro last visit -she reports: mood is great now, medication gave her a little headache she thinks but improved and wants to continue medication -denies: SI, worsening depression  Advised pap and tubal discussion with gyn last visit - went to [facility]  ROS: See pertinent positives and negatives per HPI.  No past medical history on file.  Past Surgical History  Procedure Laterality Date  . Appendectomy    . Foot surgery      Family History  Problem Relation Age of Onset  . Hypertension Mother   . Hyperlipidemia Mother   . Heart disease Mother [id number]    CAD, MI  . Hypertension Maternal Grandmother     History   Social History  . Marital Status: Single    Spouse Name: N/A    Number of Children: N/A  . Years of Education: N/A   Social History Main Topics  . Smoking status: Current Every Day Smoker  . Smokeless tobacco: None     Comment: 1 pack per week  . Alcohol Use: No  . Drug Use: No  . Sexual Activity: None   Other Topics Concern  . 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      Home Situation: lives with husband, daughter and grandchildren      Spiritual Beliefs: Baptist      Lifestyle: no regular CV; poor diet             Current outpatient prescriptions:cetirizine (ZYRTEC) 10 MG tablet, Take 10 mg by mouth daily., Disp: , Rfl: ;  diphenhydrAMINE (BENADRYL) 25 MG tablet, Take 1 tablet (25 mg total) by mouth every 6 (six) hours., Disp: 20 tablet, Rfl: 0;  escitalopram (LEXAPRO) 10 MG tablet, Take 1 tablet (10 mg total) by mouth daily., Disp: 30 tablet, Rfl: 3;  esomeprazole (NEXIUM) 40 MG capsule, Take 1 capsule (40 mg total) by mouth daily., Disp: 30 capsule, Rfl: 0 mometasone (ELOCON) 0.1 % cream, , Disp: , Rfl:   EXAM:  Filed Vitals:   08/09/13 0846  BP: 104/76   Temp: 98.4 F (36.9 C)    Body mass index is 43.04 kg/(m^2).  GENERAL: vitals reviewed and listed above, alert, oriented, appears well hydrated and in no acute distress  HEENT: atraumatic, conjunttiva clear, no obvious abnormalities on inspection of external nose and ears  NECK: no obvious masses on inspection  MS: moves all extremities without noticeable abnormality  PSYCH: pleasant and cooperative, no obvious depression or anxiety  ASSESSMENT AND PLAN:  Discussed the following assessment and plan:  Depression - Plan: escitalopram (LEXAPRO) 10 MG tablet  -continue same dose - refilled -follow up 3 months -Patient advised to return or notify a doctor immediately if symptoms worsen or persist or new concerns arise.  There are no Patient Instructions on file for this visit.   Kriste BasqueKIM, Montford Barg R.

## 2013-08-09 NOTE — Progress Notes (Signed)
Pre visit review using our clinic review tool, if applicable. No additional management support is needed unless otherwise documented below in the visit note. 

## 2013-10-15 ENCOUNTER — Telehealth: Payer: Self-pay | Admitting: Family Medicine

## 2013-10-15 NOTE — Telephone Encounter (Signed)
Per pt made her feel like she was loosing her mind" and making her very nauseous.

## 2013-10-15 NOTE — Telephone Encounter (Signed)
Pt states she had to stop taking escitalopram (LEXAPRO) 10 MG tablet It just did not agree w/ her. Pt has made fup for 4/7,  But wants to know if she could try something else prior to appt? [facility] [address]

## 2013-10-16 NOTE — Telephone Encounter (Signed)
Left a message for return call to set up earlier appointment.

## 2013-10-16 NOTE — Telephone Encounter (Signed)
She reported she liked the medication and wished to continue it at the last appt, so I am not sure this was the medication. We can discuss other options at her appt. If she needs to be seen sooner can reschedule the appt.

## 2013-10-17 NOTE — Telephone Encounter (Signed)
Called and spoke with pt and pt is aware.  Pt would like a call back on Monday to be seen for visit.

## 2013-10-21 NOTE — Telephone Encounter (Signed)
Pt had to go to work today and will keep appointment for April.

## 2013-11-12 ENCOUNTER — Encounter: Payer: Self-pay | Admitting: Family Medicine

## 2013-11-12 ENCOUNTER — Ambulatory Visit (INDEPENDENT_AMBULATORY_CARE_PROVIDER_SITE_OTHER): Payer: 59 | Admitting: Family Medicine

## 2013-11-12 VITALS — BP 124/80 | HR 84 | Temp 98.7°F

## 2013-11-12 DIAGNOSIS — E669 Obesity, unspecified: Secondary | ICD-10-CM

## 2013-11-12 DIAGNOSIS — G4733 Obstructive sleep apnea (adult) (pediatric): Secondary | ICD-10-CM

## 2013-11-12 DIAGNOSIS — F32A Depression, unspecified: Secondary | ICD-10-CM

## 2013-11-12 DIAGNOSIS — F3289 Other specified depressive episodes: Secondary | ICD-10-CM

## 2013-11-12 DIAGNOSIS — R0683 Snoring: Secondary | ICD-10-CM

## 2013-11-12 DIAGNOSIS — R0609 Other forms of dyspnea: Secondary | ICD-10-CM

## 2013-11-12 DIAGNOSIS — F329 Major depressive disorder, single episode, unspecified: Secondary | ICD-10-CM | POA: Insufficient documentation

## 2013-11-12 DIAGNOSIS — R0989 Other specified symptoms and signs involving the circulatory and respiratory systems: Secondary | ICD-10-CM

## 2013-11-12 DIAGNOSIS — J309 Allergic rhinitis, unspecified: Secondary | ICD-10-CM

## 2013-11-12 DIAGNOSIS — J302 Other seasonal allergic rhinitis: Secondary | ICD-10-CM

## 2013-11-12 HISTORY — DX: Depression, unspecified: F32.A

## 2013-11-12 MED ORDER — FLUTICASONE PROPIONATE 50 MCG/ACT NA SUSP: 2.0000 | Freq: Every day | NASAL | Status: DC

## 2013-11-12 NOTE — Progress Notes (Signed)
Chief Complaint  Patient presents with  . Allergies    HPI:  Depression: -does not feel depressed anymore -she stopped the lexapro a while ago -she has a lot of stress related to the mirena - feels like she should not have done the mirena - has appt with gyn coming up about this  Allergy issues: -lots of drainage, PND, watery eyes, cough, sneezing -never took anything for this  ? OSA: -snores -feels like she is not rested when wakes up in the morning -wants sleep study  ROS: See pertinent positives and negatives per HPI.  Past Medical History  Diagnosis Date  . Depression 11/12/2013    Past Surgical History  Procedure Laterality Date  . Appendectomy    . Foot surgery      Family History  Problem Relation Age of Onset  . Hypertension Mother   . Hyperlipidemia Mother   . Heart disease Mother [phone]    CAD, MI  . Hypertension Maternal Grandmother     History   Social History  . Marital Status: Single    Spouse Name: N/A    Number of Children: N/A  . Years of Education: N/A   Social History Main Topics  . Smoking status: Current Every Day Smoker  . Smokeless tobacco: None     Comment: 1 pack per week  . Alcohol Use: No  . Drug Use: No  . Sexual Activity: None   Other Topics Concern  . 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      Home Situation: lives with husband, daughter and grandchildren      Spiritual Beliefs: Baptist      Lifestyle: no regular CV; poor diet             Current outpatient prescriptions:cetirizine (ZYRTEC) 10 MG tablet, Take 10 mg by mouth daily., Disp: , Rfl: ;  diphenhydrAMINE (BENADRYL) 25 MG tablet, Take 1 tablet (25 mg total) by mouth every 6 (six) hours., Disp: 20 tablet, Rfl: 0;  esomeprazole (NEXIUM) 40 MG capsule, Take 1 capsule (40 mg total) by mouth daily., Disp: 30 capsule, Rfl: 0;  escitalopram (LEXAPRO) 10 MG tablet, Take 1 tablet (10 mg total) by mouth daily., Disp: 30  tablet, Rfl: 3 fluticasone (FLONASE) 50 MCG/ACT nasal spray, Place 2 sprays into both nostrils daily., Disp: 16 g, Rfl: 2;  mometasone (ELOCON) 0.1 % cream, , Disp: , Rfl:   EXAM:  Filed Vitals:   11/12/13 0816  BP: 124/80  Pulse: 84  Temp: 98.7 F (37.1 C)    Body mass index is 0.00 kg/(m^2).  GENERAL: vitals reviewed and listed above, alert, oriented, appears well hydrated and in no acute distress  HEENT: atraumatic, conjunttiva clear, no obvious abnormalities on inspection of external nose and ears, normal appearance of ear canals and TMs, clear nasal congestion, mild post oropharyngeal erythema with PND, no tonsillar edema or exudate, no sinus TTP  NECK: no obvious masses on inspection  LUNGS: clear to auscultation bilaterally, no wheezes, rales or rhonchi, good air movement  CV: HRRR, no peripheral edema  MS: moves all extremities without noticeable abnormality  PSYCH: pleasant and cooperative, no obvious depression or anxiety  ASSESSMENT AND PLAN:  Discussed the following assessment and plan:  Depression  Snoring - Plan: Ambulatory referral to Sleep Studies  OSA (obstructive sleep apnea) - Plan: Ambulatory referral to Sleep Studies  Seasonal allergies - Plan: fluticasone (FLONASE) 50 MCG/ACT nasal spray  Obesity,  unspecified - Plan: Ambulatory referral to Sleep Studies  -INS for allergies, may add antihistamine if needed -referred for sleep study -she has stopped the zoloft and wants to stay off antidepressant for now -Patient advised to return or notify a doctor immediately if symptoms worsen or persist or new concerns arise.  Patient Instructions  -start the flonase 2 sprays each nostril  -in two week start zyrtec if needed for the sinus congestion  -We placed a referral for you as discussed for the sleep study. It usually takes about 1-2 weeks to process and schedule this referral. If you have not heard from [place] regarding this appointment in 2 weeks please  contact our office.   -follow up in 4 weeks     Rupinder Livingston R.

## 2013-11-12 NOTE — Patient Instructions (Signed)
-  start the flonase 2 sprays each nostril  -in two week start zyrtec if needed for the sinus congestion  -We placed a referral for you as discussed for the sleep study. It usually takes about 1-2 weeks to process and schedule this referral. If you have not heard from us regarding this appointment in 2 weeks please contact our office.   -follow up in 4 weeks

## 2013-11-12 NOTE — Progress Notes (Signed)
Pre visit review using our clinic review tool, if applicable. No additional management support is needed unless otherwise documented below in the visit note. 

## 2013-11-13 ENCOUNTER — Telehealth: Payer: Self-pay | Admitting: Family Medicine

## 2013-11-13 NOTE — Telephone Encounter (Signed)
Relevant patient education assigned to patient using Emmi. ° °

## 2013-11-20 ENCOUNTER — Ambulatory Visit (INDEPENDENT_AMBULATORY_CARE_PROVIDER_SITE_OTHER): Payer: 59 | Admitting: Pulmonary Disease

## 2013-11-20 ENCOUNTER — Encounter: Payer: Self-pay | Admitting: Pulmonary Disease

## 2013-11-20 VITALS — BP 124/84 | HR 79 | Ht 67.0 in | Wt 291.0 lb

## 2013-11-20 DIAGNOSIS — G4733 Obstructive sleep apnea (adult) (pediatric): Secondary | ICD-10-CM

## 2013-11-20 HISTORY — DX: Obstructive sleep apnea (adult) (pediatric): G47.33

## 2013-11-20 NOTE — Progress Notes (Deleted)
   Subjective:    Patient ID: Heidi Goodman, female    DOB: 1971/05/23, 43 y.o.   MRN: [id number]  HPI    Review of Systems  Constitutional: Negative for fever, chills, diaphoresis, activity change, appetite change, fatigue and unexpected weight change.  HENT: Negative for congestion, dental problem, ear discharge, ear pain, facial swelling, hearing loss, mouth sores, nosebleeds, postnasal drip, rhinorrhea, sinus pressure, sneezing, sore throat, tinnitus, trouble swallowing and voice change.   Eyes: Negative for photophobia, discharge, itching and visual disturbance.  Respiratory: Positive for apnea, chest tightness and shortness of breath. Negative for cough, choking, wheezing and stridor.   Cardiovascular: Negative for chest pain, palpitations and leg swelling.  Gastrointestinal: Negative for nausea, vomiting, abdominal pain, constipation, blood in stool and abdominal distention.  Genitourinary: Negative for dysuria, urgency, frequency, hematuria, flank pain, decreased urine volume and difficulty urinating.  Musculoskeletal: Negative for arthralgias, back pain, gait problem, joint swelling, myalgias, neck pain and neck stiffness.  Skin: Negative for color change, pallor and rash.  Neurological: Negative for dizziness, tremors, seizures, syncope, speech difficulty, weakness, light-headedness, numbness and headaches.  Hematological: Negative for adenopathy. Does not bruise/bleed easily.  Psychiatric/Behavioral: Negative for confusion, sleep disturbance and agitation. The patient is not nervous/anxious.        Objective:   Physical Exam        Assessment & Plan:

## 2013-11-20 NOTE — Progress Notes (Signed)
Chief Complaint  Patient presents with  . Sleep Consult    referred by Dr. Selena BattenKim for OSA. Epworth Score: 17.    History of Present Illness: Mathews Robinsonsabitha T Rish is a 43 y.o. female for evaluation of sleep problems.  She has history of depression.  She was seen by PCP recently, and there was concern for snoring and daytime sleepiness.  She has been referred to pulmonary/sleep medicine to assess for sleep apnea.  Her husband was concerned about her stopping breathing while asleep, and has to wake her up to breath.  She also wakes up feeling like she is choked.  She snores at night also.  She falls asleep whenever she is sitting quiet.  She has to drink coffee to help her stay awake.  She goes to sleep between 9 and 11 pm.  She falls asleep quickly.  She wakes up 1 or 2 times to use the bathroom.  She will sometimes have trouble falling back to sleep.  She gets out of bed at 7 am.  She feels tired in the morning.  She frequent morning headache and takes excedrin migraine.  She does not use anything to help her fall sleep.  She denies sleep walking, sleep talking, bruxism, or nightmares.  There is no history of restless legs.  She denies sleep hallucinations, sleep paralysis, or cataplexy.  The Epworth score is 17 out of 24.  Mathews Robinsonsabitha T Woehl  has a past medical history of Depression (11/12/2013).  Rya T Whitham  has past surgical history that includes Appendectomy and Foot surgery.  Prior to Admission medications   Medication Sig Start Date End Date Taking? Authorizing Provider  cetirizine (ZYRTEC) 10 MG tablet Take 10 mg by mouth daily.   Yes Historical Provider, MD  diphenhydrAMINE (BENADRYL) 25 MG tablet Take 1 tablet (25 mg total) by mouth every 6 (six) hours. 05/01/13  Yes Fayrene HelperBowie Tran, PA-C  escitalopram (LEXAPRO) 10 MG tablet Take 1 tablet (10 mg total) by mouth daily. 08/09/13  Yes Terressa KoyanagiHannah R. Kim, DO  esomeprazole (NEXIUM) 40 MG capsule Take 1 capsule (40 mg total) by mouth daily. 04/29/13  Yes  Rolan BuccoMelanie Belfi, MD  fluticasone (FLONASE) 50 MCG/ACT nasal spray Place 2 sprays into both nostrils daily. 11/12/13  Yes Terressa KoyanagiHannah R. Kim, DO    Allergies  Allergen Reactions  . Ivp Dye [Iodinated Diagnostic Agents] Swelling  . Shellfish Allergy     Her family history includes Heart disease (age of onset: 562) in her mother; Hyperlipidemia in her mother; Hypertension in her maternal grandmother and mother.  She  reports that she has been smoking.  She does not have any smokeless tobacco history on file. She reports that she does not drink alcohol or use illicit drugs.  Review of Systems  Constitutional: Negative for fever, chills, diaphoresis, activity change, appetite change, fatigue and unexpected weight change.  HENT: Negative for congestion, dental problem, ear discharge, ear pain, facial swelling, hearing loss, mouth sores, nosebleeds, postnasal drip, rhinorrhea, sinus pressure, sneezing, sore throat, tinnitus, trouble swallowing and voice change.   Eyes: Negative for photophobia, discharge, itching and visual disturbance.  Respiratory: Positive for apnea, chest tightness and shortness of breath. Negative for cough, choking, wheezing and stridor.   Cardiovascular: Negative for chest pain, palpitations and leg swelling.  Gastrointestinal: Negative for nausea, vomiting, abdominal pain, constipation, blood in stool and abdominal distention.  Genitourinary: Negative for dysuria, urgency, frequency, hematuria, flank pain, decreased urine volume and difficulty urinating.  Musculoskeletal: Negative for arthralgias, back  pain, gait problem, joint swelling, myalgias, neck pain and neck stiffness.  Skin: Negative for color change, pallor and rash.  Neurological: Negative for dizziness, tremors, seizures, syncope, speech difficulty, weakness, light-headedness, numbness and headaches.  Hematological: Negative for adenopathy. Does not bruise/bleed easily.  Psychiatric/Behavioral: Negative for confusion,  sleep disturbance and agitation. The patient is not nervous/anxious.    Physical Exam:  General - No distress ENT - No sinus tenderness, no oral exudate, no LAN, no thyromegaly, TM clear, pupils equal/reactive, MP 4, enlarged tongue Cardiac - s1s2 regular, no murmur, pulses symmetric Chest - No wheeze/rales/dullness, good air entry, normal respiratory excursion Back - No focal tenderness Abd - Soft, non-tender, no organomegaly, + bowel sounds Ext - No edema Neuro - Normal strength, cranial nerves intact Skin - No rashes Psych - Normal mood, and behavior  Assessment/plan:  Babak Lucus,Coralyn Helling M.D. Pager [phone]

## 2013-11-20 NOTE — Assessment & Plan Note (Addendum)
She has snoring, sleep disruption,witnessed apnea, and daytime sleepiness.  She has a history of depression.  I am concerned she could have sleep apnea.  Her BMI is > 35.  We discussed how sleep apnea can affect various health problems including risks for hypertension, cardiovascular disease, and diabetes.  We also discussed how sleep disruption can increase risks for accident, such as while driving.  Weight loss as a means of improving sleep apnea was also reviewed.  Additional treatment options discussed were CPAP therapy, oral appliance, and surgical intervention.  To further assess will arrange for home sleep study.

## 2013-11-20 NOTE — Patient Instructions (Signed)
Will arrange for home sleep study Will call to arrange for follow up after sleep study reviewed  

## 2013-11-27 ENCOUNTER — Telehealth: Payer: Self-pay | Admitting: Family Medicine

## 2013-11-27 ENCOUNTER — Telehealth: Payer: Self-pay | Admitting: Pulmonary Disease

## 2013-11-27 NOTE — Telephone Encounter (Signed)
Pt called in and was told HST'S are about 2-3wks out she will be called as soon as the machine is available for her to take home Heidi Goodman

## 2013-11-27 NOTE — Telephone Encounter (Signed)
Patient Information:  Caller Name: Wyatt Mageabitha  Phone: [phone]  Patient: Heidi Goodman, Prezley  Gender: Female  DOB: [birth year]  Age: 43 Years  PCP: Selena BattenKim (TEXT 1st, after 20 mins can call), Dahlia ClientHannah [facility](Family Practice)  Pregnant: No  Office Follow Up:  Does the office need to follow up with this patient?: No  Instructions For The Office: N/A  RN Note:  Blood pressure checked at 1600.  Patient seeing spots.  No chest pain, dizziness, or difficulty breathing.  Symptoms  Reason For Call & Symptoms: Blood Pressure elevated 152/100 and seeing spots  Reviewed Health History In EMR: Yes  Reviewed Medications In EMR: Yes  Reviewed Allergies In EMR: Yes  Reviewed Surgeries / Procedures: Yes  Date of Onset of Symptoms: 11/27/2013 OB / GYN:  LMP: 11/06/2013  Guideline(s) Used:  High Blood Pressure  Disposition Per Guideline:   See Within 2 Weeks in Office  Reason For Disposition Reached:   BP > 140/90 and is not taking BP medications  Advice Given:  What Is A Healthy Diet?  Eat a variety of vegetables and fruits. Aim for five servings per day.  Eat fish at least two times each week. Fatty fish such as salmon and tuna are best.  Call Back If:  Headache, blurred vision, difficulty talking, or difficulty walking occurs  Chest pain or difficulty breathing occurs  You want to come in to the office for a blood pressure check  You become worse.  What Is A Healthy Diet?  Eat a variety of vegetables and fruits. Aim for five servings per day.  Eat fish at least two times each week. Fatty fish such as salmon and tuna are best.  Patient Will Follow Care Advice:  YES

## 2013-11-27 NOTE — Telephone Encounter (Signed)
I see where Home sleep study test order was placed-however I do not see where anything has been done with order-[facility] and contact patient. I left message on patients number provided that I have [facility] advise and they will contact her.   [facility] please advise on the status of the home sleep study order. Thanks.

## 2013-12-02 ENCOUNTER — Ambulatory Visit (INDEPENDENT_AMBULATORY_CARE_PROVIDER_SITE_OTHER): Payer: 59 | Admitting: Family Medicine

## 2013-12-02 ENCOUNTER — Encounter: Payer: Self-pay | Admitting: Family Medicine

## 2013-12-02 VITALS — BP 120/80 | HR 69 | Temp 98.5°F | Ht 67.0 in | Wt 250.0 lb

## 2013-12-02 DIAGNOSIS — F329 Major depressive disorder, single episode, unspecified: Secondary | ICD-10-CM

## 2013-12-02 DIAGNOSIS — F32A Depression, unspecified: Secondary | ICD-10-CM

## 2013-12-02 DIAGNOSIS — IMO0001 Reserved for inherently not codable concepts without codable children: Secondary | ICD-10-CM

## 2013-12-02 DIAGNOSIS — Z8669 Personal history of other diseases of the nervous system and sense organs: Secondary | ICD-10-CM | POA: Insufficient documentation

## 2013-12-02 DIAGNOSIS — E669 Obesity, unspecified: Secondary | ICD-10-CM

## 2013-12-02 DIAGNOSIS — R030 Elevated blood-pressure reading, without diagnosis of hypertension: Secondary | ICD-10-CM

## 2013-12-02 DIAGNOSIS — F3289 Other specified depressive episodes: Secondary | ICD-10-CM

## 2013-12-02 NOTE — Progress Notes (Signed)
Pre visit review using our clinic review tool, if applicable. No additional management support is needed unless otherwise documented below in the visit note. 

## 2013-12-02 NOTE — Progress Notes (Signed)
No chief complaint on file.   HPI:  Acute visit for:  1)HTN: -reports: 150s/100 and up today at gyn appt 150s/90 - pt thinks it is related to mirena (for dysmenorrhea) as reports has never had BP before - when stood up at work recent saw floaters or spots when had a migraine (long hx of migraines), chronic bilat LE edema -denies: denies CP, SOB  2)Allergic Rhinitis: -advised flonase, and zyrtec if needed last visit -reports: doing a little better  ROS: See pertinent positives and negatives per HPI.  Past Medical History  Diagnosis Date  . Depression 11/12/2013    Past Surgical History  Procedure Laterality Date  . Appendectomy    . Foot surgery      Family History  Problem Relation Age of Onset  . Hypertension Mother   . Hyperlipidemia Mother   . Heart disease Mother 2162    CAD, MI  . Hypertension Maternal Grandmother     History   Social History  . Marital Status: Single    Spouse Name: N/A    Number of Children: N/A  . Years of Education: N/A   Social History Main Topics  . Smoking status: Current Every Day Smoker -- 0.10 packs/day for 18 years    Types: Cigarettes  . Smokeless tobacco: Never Used     Comment: 1 pack per week  . Alcohol Use: No  . Drug Use: No  . Sexual Activity: None   Other Topics Concern  . 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      Home Situation: lives with husband, daughter and grandchildren      Spiritual Beliefs: Baptist      Lifestyle: no regular CV; poor diet             Current outpatient prescriptions:cetirizine (ZYRTEC) 10 MG tablet, Take 10 mg by mouth daily., Disp: , Rfl: ;  diphenhydrAMINE (BENADRYL) 25 MG tablet, Take 1 tablet (25 mg total) by mouth every 6 (six) hours., Disp: 20 tablet, Rfl: 0;  escitalopram (LEXAPRO) 10 MG tablet, Take 1 tablet (10 mg total) by mouth daily., Disp: 30 tablet, Rfl: 3;  esomeprazole (NEXIUM) 40 MG capsule, Take 1 capsule (40 mg total)  by mouth daily., Disp: 30 capsule, Rfl: 0 fluticasone (FLONASE) 50 MCG/ACT nasal spray, Place 2 sprays into both nostrils daily., Disp: 16 g, Rfl: 2  EXAM:  Filed Vitals:   12/02/13 1318  BP: 120/80  Pulse: 69  Temp: 98.5 F (36.9 C)    Body mass index is 39.15 kg/(m^2).  GENERAL: vitals reviewed and listed above, alert, oriented, appears well hydrated and in no acute distress  HEENT: atraumatic, conjunttiva clear, no obvious abnormalities on inspection of external nose and ears  NECK: no obvious masses on inspection  LUNGS: clear to auscultation bilaterally, no wheezes, rales or rhonchi, good air movement  CV: HRRR, tr peripheral edema  MS: moves all extremities without noticeable abnormality  PSYCH: pleasant and cooperative, no obvious depression or anxiety  ASSESSMENT AND PLAN:  Discussed the following assessment and plan:  Depression  Hx of migraines  Obesity, unspecified  Elevated BP  -we discussed possible serious and likely etiologies, workup and treatment, treatment risks and return precautions - discussed that hormonal birth control can cause HTN, headaches, etc and advised if these issues persist or worsen may need to consider stopping -neuro exam and BP great here today -advise eval with optho though likely more migraine  or allergy related -after this discussion, Heidi Goodman opted for monitoring BP at home and follow up in 1 month  -may consider hctz if BP running >140/90 on ave - has always been great here -of course, we advised Heidi Goodman  to return or notify a doctor immediately if symptoms worsen or persist or new concerns arise.   -Patient advised to return or notify a doctor immediately if symptoms worsen or persist or new concerns arise.  Patient Instructions  -please check your blood pressure several days per week and keep a log - stay seated for 5 minutes before checking - bring log and cuff to appointment   -We recommend the following healthy  lifestyle measures: - eat a healthy diet consisting of lots of vegetables, fruits, beans, nuts, seeds, healthy meats such as white chicken and fish and whole grains.  - avoid fried foods, fast food, processed foods, sodas, red meet and other fattening foods.  - get a least 150 minutes of aerobic exercise per week.   Follow up in 1 month or sooner as needed     Heidi Goodman

## 2013-12-02 NOTE — Patient Instructions (Signed)
-  please check your blood pressure several days per week and keep a log - stay seated for 5 minutes before checking - bring log and cuff to appointment   -We recommend the following healthy lifestyle measures: - eat a healthy diet consisting of lots of vegetables, fruits, beans, nuts, seeds, healthy meats such as white chicken and fish and whole grains.  - avoid fried foods, fast food, processed foods, sodas, red meet and other fattening foods.  - get a least 150 minutes of aerobic exercise per week.   Follow up in 1 month or sooner as needed

## 2013-12-03 ENCOUNTER — Telehealth: Payer: Self-pay | Admitting: Family Medicine

## 2013-12-03 NOTE — Telephone Encounter (Signed)
Relevant patient education assigned to patient using Emmi. ° °

## 2013-12-06 ENCOUNTER — Ambulatory Visit: Payer: 59 | Admitting: Family Medicine

## 2013-12-24 DIAGNOSIS — G4733 Obstructive sleep apnea (adult) (pediatric): Secondary | ICD-10-CM

## 2013-12-31 ENCOUNTER — Encounter: Payer: 59 | Admitting: Family Medicine

## 2013-12-31 DIAGNOSIS — Z0289 Encounter for other administrative examinations: Secondary | ICD-10-CM

## 2013-12-31 NOTE — Progress Notes (Signed)
Error   This encounter was created in error - please disregard. 

## 2014-01-01 ENCOUNTER — Telehealth: Payer: Self-pay | Admitting: Pulmonary Disease

## 2014-01-01 DIAGNOSIS — G4733 Obstructive sleep apnea (adult) (pediatric): Secondary | ICD-10-CM

## 2014-01-01 NOTE — Telephone Encounter (Signed)
HST 12/24/13 >> AHI 11.5, SaO2 low 85%.  Will have my nurse inform pt that sleep study shows mild sleep apnea.  She will need ROV to discuss results and treatment options in more detail.

## 2014-01-01 NOTE — Telephone Encounter (Signed)
lmtcb x1 

## 2014-01-01 NOTE — Telephone Encounter (Signed)
lmtcb x2 

## 2014-01-01 NOTE — Telephone Encounter (Signed)
Pt wanting results of sleep study.Heidi Goodman

## 2014-01-02 NOTE — Telephone Encounter (Signed)
Pt returning call.Heidi Goodman ° °

## 2014-01-02 NOTE — Telephone Encounter (Signed)
LMTC x 1  

## 2014-01-02 NOTE — Telephone Encounter (Signed)
Pt called back and she is aware of results per VS.  appt has been scheduled for the pt in July---this was the next aval appt.  Pt is ok with this appt. Nothing further is needed.

## 2014-01-14 ENCOUNTER — Encounter: Payer: Self-pay | Admitting: Pulmonary Disease

## 2014-02-13 ENCOUNTER — Encounter: Payer: Self-pay | Admitting: Pulmonary Disease

## 2014-02-13 ENCOUNTER — Ambulatory Visit (INDEPENDENT_AMBULATORY_CARE_PROVIDER_SITE_OTHER): Payer: 59 | Admitting: Pulmonary Disease

## 2014-02-13 ENCOUNTER — Encounter (INDEPENDENT_AMBULATORY_CARE_PROVIDER_SITE_OTHER): Payer: Self-pay

## 2014-02-13 VITALS — BP 138/86 | HR 76 | Ht 67.0 in | Wt 293.2 lb

## 2014-02-13 DIAGNOSIS — K219 Gastro-esophageal reflux disease without esophagitis: Secondary | ICD-10-CM

## 2014-02-13 DIAGNOSIS — Z720 Tobacco use: Secondary | ICD-10-CM

## 2014-02-13 DIAGNOSIS — F17200 Nicotine dependence, unspecified, uncomplicated: Secondary | ICD-10-CM

## 2014-02-13 DIAGNOSIS — G4733 Obstructive sleep apnea (adult) (pediatric): Secondary | ICD-10-CM

## 2014-02-13 DIAGNOSIS — E669 Obesity, unspecified: Secondary | ICD-10-CM

## 2014-02-13 NOTE — Assessment & Plan Note (Signed)
Explained how sleep apnea can contribute to nocturnal reflux.  Also explained how smoking and obesity can increase risk for reflux.  She can continue OTC meds for now.  She may need further evaluation if this persists >> defer to her PCP.

## 2014-02-13 NOTE — Patient Instructions (Signed)
Will arrange for CPAP set up  Follow up in 2 months after CPAP set up 

## 2014-02-13 NOTE — Progress Notes (Signed)
Chief Complaint  Patient presents with  . Follow-up    Discuss sleep study. Pt c/o increased fatigue, lightheadedness and dizziness today.     History of Present Illness: Heidi Goodman Licklider is a 43 y.o. female smoker with OSA.  She is here to review her sleep study.  This showed mild sleep apnea with oxygen desaturation.  She was seen by her PCP and discussed wt loss.  She was told she couldn'Goodman use medicine for wt loss due to elevated blood pressure.  She has noticed trouble with reflux >> this happens day and night.  She continues to smoke a few cigarettes per day >> she only smokes at work, and does this to help alleviate stress.   TESTS: HST 12/24/13 >> AHI 11.5, SaO2 low 85%.  Heidi Goodman Ingle  has a past medical history of Depression (11/12/2013) and OSA (obstructive sleep apnea) (11/20/2013).  Krisinda Goodman Bartok  has past surgical history that includes Appendectomy and Foot surgery.  Prior to Admission medications   Medication Sig Start Date End Date Taking? Authorizing Provider  cetirizine (ZYRTEC) 10 MG tablet Take 10 mg by mouth daily.   Yes Historical Provider, MD  diphenhydrAMINE (BENADRYL) 25 MG tablet Take 1 tablet (25 mg total) by mouth every 6 (six) hours. 05/01/13  Yes Fayrene HelperBowie Tran, PA-C  escitalopram (LEXAPRO) 10 MG tablet Take 1 tablet (10 mg total) by mouth daily. 08/09/13  Yes Terressa KoyanagiHannah R. Kim, DO  esomeprazole (NEXIUM) 40 MG capsule Take 1 capsule (40 mg total) by mouth daily. 04/29/13  Yes Rolan BuccoMelanie Belfi, MD  fluticasone (FLONASE) 50 MCG/ACT nasal spray Place 2 sprays into both nostrils daily. 11/12/13  Yes Terressa KoyanagiHannah R. Kim, DO    Allergies  Allergen Reactions  . Ivp Dye [Iodinated Diagnostic Agents] Swelling  . Shellfish Allergy      Physical Exam:  General - No distress ENT - No sinus tenderness, no oral exudate, no LAN, MP 4, enlarged tongue Cardiac - s1s2 regular, no murmur Chest - No wheeze/rales/dullness Back - No focal tenderness Abd - Soft, non-tender Ext - No  edema Neuro - Normal strength Skin - No rashes Psych - normal mood, and behavior   Assessment/Plan:  Coralyn HellingVineet Malike Foglio, MD [place] [facility]/Critical Care/Sleep Pager:  [phone]

## 2014-02-13 NOTE — Assessment & Plan Note (Signed)
Discussed different options to assist with smoking cessation.

## 2014-02-13 NOTE — Assessment & Plan Note (Signed)
She has mild sleep apnea on home sleep study.   I have reviewed the recent sleep study results with the patient.  We discussed how sleep apnea can affect various health problems including risks for hypertension, cardiovascular disease, and diabetes.  We also discussed how sleep disruption can increase risks for accident, such as while driving.  Weight loss as a means of improving sleep apnea was also reviewed.  Additional treatment options discussed were CPAP therapy, oral appliance, and surgical intervention.  Will arrange for auto CPAP set up.

## 2014-02-13 NOTE — Assessment & Plan Note (Signed)
Discussed different techniques to assist with wt loss.  Advised her to wait and re-assess her status after starting CPAP therapy before proceeding with medication therapy for obesity.

## 2014-02-25 ENCOUNTER — Telehealth: Payer: Self-pay | Admitting: Pulmonary Disease

## 2014-02-25 DIAGNOSIS — G4733 Obstructive sleep apnea (adult) (pediatric): Secondary | ICD-10-CM

## 2014-02-25 NOTE — Telephone Encounter (Signed)
Called # above. Line rang several times and no answer wcb

## 2014-02-26 NOTE — Telephone Encounter (Signed)
I called spoke with pt. She is not sure if she is allergic to her mask. She reports on right side is swollen/red, rash underneath. Left side is not like this. Please advise Dr. Craige CottaSood thanks  Allergies  Allergen Reactions  . Ivp Dye [Iodinated Diagnostic Agents] Swelling  . Shellfish Allergy

## 2014-02-26 NOTE — Telephone Encounter (Signed)
Called spoke with patient, advised of VS' recommendation as stated below.  Pt stated she has taken some Benadryl but will increase to every 6 hours is needed; she is aware to use caution with driving as this medication can make her sleepy if taken during the day.  [facility] and [facility] 6[facility] to have them look at her machine; she would like to know if they can re-assess CPAP mask fitting at that time.  [facility] for pt and [facility]    Pt is aware to call the office if her symptoms do not improve or they worsen.  Nothing further needed at this time; will sign off.

## 2014-02-26 NOTE — Telephone Encounter (Signed)
She can take benadryl 25 to 50 mg every 6 hours as needed to help with rash > please advised her to use caution with driving if she takes benadryl during the day as this medicine can make her sleepy.  Please also have her DME re-assess her mask fit.

## 2014-03-04 ENCOUNTER — Telehealth: Payer: Self-pay | Admitting: Pulmonary Disease

## 2014-03-04 NOTE — Telephone Encounter (Signed)
Auto CPAP 02/20/14 to 02/26/14 >> used on 7 of 7 nights with average 7 hrs and 52 min.  Average AHI is 1.6 with median CPAP 11 cm H2O and 95 th percentile CPAP 14 cm H20.   Will have my nurse inform pt that CPAP report shows very good control of sleep apnea.  No change to current set up.

## 2014-03-05 NOTE — Telephone Encounter (Signed)
Results have been explained to patient husband, expressed understanding. Nothing further needed.  

## 2014-04-17 ENCOUNTER — Encounter: Payer: Self-pay | Admitting: Pulmonary Disease

## 2014-04-17 ENCOUNTER — Ambulatory Visit (INDEPENDENT_AMBULATORY_CARE_PROVIDER_SITE_OTHER): Payer: 59 | Admitting: Pulmonary Disease

## 2014-04-17 ENCOUNTER — Encounter (INDEPENDENT_AMBULATORY_CARE_PROVIDER_SITE_OTHER): Payer: Self-pay

## 2014-04-17 VITALS — BP 110/82 | HR 66 | Ht 67.0 in | Wt 284.0 lb

## 2014-04-17 DIAGNOSIS — J3489 Other specified disorders of nose and nasal sinuses: Secondary | ICD-10-CM

## 2014-04-17 DIAGNOSIS — Z720 Tobacco use: Secondary | ICD-10-CM

## 2014-04-17 DIAGNOSIS — G4733 Obstructive sleep apnea (adult) (pediatric): Secondary | ICD-10-CM

## 2014-04-17 DIAGNOSIS — Z9989 Dependence on other enabling machines and devices: Principal | ICD-10-CM

## 2014-04-17 DIAGNOSIS — R0981 Nasal congestion: Secondary | ICD-10-CM | POA: Insufficient documentation

## 2014-04-17 DIAGNOSIS — F17200 Nicotine dependence, unspecified, uncomplicated: Secondary | ICD-10-CM

## 2014-04-17 MED ORDER — CETIRIZINE HCL 10 MG PO TABS: 10.0000 mg | ORAL_TABLET | Freq: Every day | ORAL | Status: DC | PRN

## 2014-04-17 NOTE — Assessment & Plan Note (Signed)
Advised her to try using nasal irrigation nightly before flonase, and try zyrtec prn.

## 2014-04-17 NOTE — Assessment & Plan Note (Signed)
She has switched to electronic cigarettes.  Explained that this should be viewed as a bridge to get away from tobacco/nicotine products completely.

## 2014-04-17 NOTE — Patient Instructions (Signed)
Will change CPAP setting range Try using salt water spray (nasal irrigation) nightly prior to using flonase Can try zyrtec 10 mg nightly as needed for nasal congestion Follow up in 6 months

## 2014-04-17 NOTE — Progress Notes (Signed)
Chief Complaint  Patient presents with  . Follow-up    Wears CPAP nightly x 8hrs. Pt states that she feels she is suffocating.     History of Present Illness: Heidi Goodman is a 43 y.o. female smoker with OSA.  She has done okay with CPAP, but has noticed times in which she takes the mask off.  Her husband says she is gasping when this happens.  This does not happen often.  She has been getting more sinus congestion.  She uses flonase.  She is not sure how to adjust her CPAP humidifier.  TESTS: HST 12/24/13 >> AHI 11.5, SaO2 low 85%. Auto CPAP 02/20/14 to 02/26/14 >> used on 7 of 7 nights with average 7 hrs and 52 min. Average AHI is 1.6 with median CPAP 11 cm H2O and 95 th percentile CPAP 14 cm H20.  PMHx, PSHx, Medications, Allergies, Fhx, Shx reviewed.  Physical Exam:  General - No distress ENT - No sinus tenderness, no oral exudate, no LAN, MP 4, enlarged tongue Cardiac - s1s2 regular, no murmur Chest - No wheeze/rales/dullness Back - No focal tenderness Abd - Soft, non-tender Ext - No edema Neuro - Normal strength Skin - No rashes Psych - normal mood, and behavior   Assessment/Plan:  Coralyn Helling, MD [place] [facility]monary/Critical Care/Sleep Pager:  [phone]

## 2014-04-17 NOTE — Assessment & Plan Note (Signed)
She is compliant with CPAP and reports benefit.  Will increase her CPAP range to 5 to 20 cm H2O.  She will check with her DME about how to adjust her humidifier.

## 2014-06-08 ENCOUNTER — Telehealth: Payer: Self-pay | Admitting: Pulmonary Disease

## 2014-06-08 NOTE — Telephone Encounter (Signed)
Auto CPAP 04/23/14 to 05/22/14 >> used on 28 of 30 nights with average 6 hrs and 39 min.  Average AHI is 1.1 with median CPAP 10 cm H2O and 95 th percentile CPAP 13 cm H20.   Will have my nurse inform pt that CPAP report looks good.  No change to current setup needed.

## 2014-06-12 NOTE — Telephone Encounter (Signed)
LMTC x 1  

## 2014-06-20 NOTE — Telephone Encounter (Signed)
Called number listed [phone] (H) Man that answered stated that this was not her number any longer. Gave me a number to contact her on.  Called number given, [phone], NA, unable to leave vmail. wcb

## 2014-06-25 NOTE — Telephone Encounter (Signed)
LM x 1 @ [phone]

## 2014-06-27 NOTE — Telephone Encounter (Signed)
Spoke with pt and informed of CPAP download results per Dr Craige CottaSood.

## 2014-11-13 ENCOUNTER — Ambulatory Visit (INDEPENDENT_AMBULATORY_CARE_PROVIDER_SITE_OTHER): Payer: 59 | Admitting: Pulmonary Disease

## 2014-11-13 ENCOUNTER — Encounter: Payer: Self-pay | Admitting: Pulmonary Disease

## 2014-11-13 ENCOUNTER — Encounter (INDEPENDENT_AMBULATORY_CARE_PROVIDER_SITE_OTHER): Payer: Self-pay

## 2014-11-13 VITALS — BP 130/82 | HR 75 | Temp 98.3°F | Ht 71.0 in | Wt 285.0 lb

## 2014-11-13 DIAGNOSIS — R0981 Nasal congestion: Secondary | ICD-10-CM | POA: Diagnosis not present

## 2014-11-13 DIAGNOSIS — K219 Gastro-esophageal reflux disease without esophagitis: Secondary | ICD-10-CM | POA: Diagnosis not present

## 2014-11-13 DIAGNOSIS — Z720 Tobacco use: Secondary | ICD-10-CM | POA: Diagnosis not present

## 2014-11-13 DIAGNOSIS — Z9989 Dependence on other enabling machines and devices: Principal | ICD-10-CM

## 2014-11-13 DIAGNOSIS — G4733 Obstructive sleep apnea (adult) (pediatric): Secondary | ICD-10-CM | POA: Diagnosis not present

## 2014-11-13 NOTE — Progress Notes (Signed)
Chief Complaint  Patient presents with  . Follow-up    Wears cpap every night for 8 hours a night. C/O heartburn especially at night     History of Present Illness: Heidi Goodman is a 44 y.o. female smoker with OSA.  She has been doing well with CPAP.  No issues with mask fit.  She has been getting sinus congestion.  Nasal irrigation and flonase help.  She has been getting problems with heartburn, especially at night.  She has been using OTC zantac, but her symptoms persist.  She still smokes 2 cigarettes per day.  This is mostly at work, and she uses this to help relieve stress.  She doesn't need to smoke when she is at home.  TESTS: HST 12/24/13 >> AHI 11.5, SaO2 low 85%. Auto CPAP 04/23/14 to 05/22/14 >> used on 28 of 30 nights with average 6 hrs and 39 min. Average AHI is 1.1 with median CPAP 10 cm H2O and 95 th percentile CPAP 13 cm H20.  PMHx >> Depression  PSHx, Medications, Allergies, Fhx, Shx reviewed.  Physical Exam: Blood pressure 130/82, pulse 75, temperature 98.3 F (36.8 C), temperature source Oral, height 5\' 11"  (1.803 m), weight 285 lb (129.275 kg), SpO2 100 %. Body mass index is 39.77 kg/(m^2).  General - No distress ENT - No sinus tenderness, no oral exudate, no LAN, MP 4, enlarged tongue Cardiac - s1s2 regular, no murmur Chest - No wheeze/rales/dullness Back - No focal tenderness Abd - Soft, non-tender Ext - No edema Neuro - Normal strength Skin - No rashes Psych - normal mood, and behavior   Assessment/Plan:  Obstructive sleep apnea. She is compliant with therapy and reports benefit from CPAP. Plan: - continue auto CPAP  Tobacco abuse. Plan: - discussed options to use as stress relievers instead of resorting to smoking cigarette  Nasal congestion. Plan: - continue nasal irrigation and flonase  GERD. Explained how this could be related to allergies. Plan: - she can continue zantac - she can also try OTC PPI - discussed dietary  modifications - if persists, then advised she should d/w her PCP   Heidi HellingVineet Lahoma Constantin, MD [facility] Pager:  [phone]

## 2014-11-13 NOTE — Patient Instructions (Signed)
Follow up in 1 year.

## 2015-05-23 IMAGING — MG MM SCREEN MAMMOGRAM BILATERAL
4 series · 4 of 4 positions shown · non-contrast
Comparison: None.

CLINICAL DATA: Screening. Baseline evaluation

EXAM:
DIGITAL SCREENING BILATERAL MAMMOGRAM WITH CAD

[R CC]
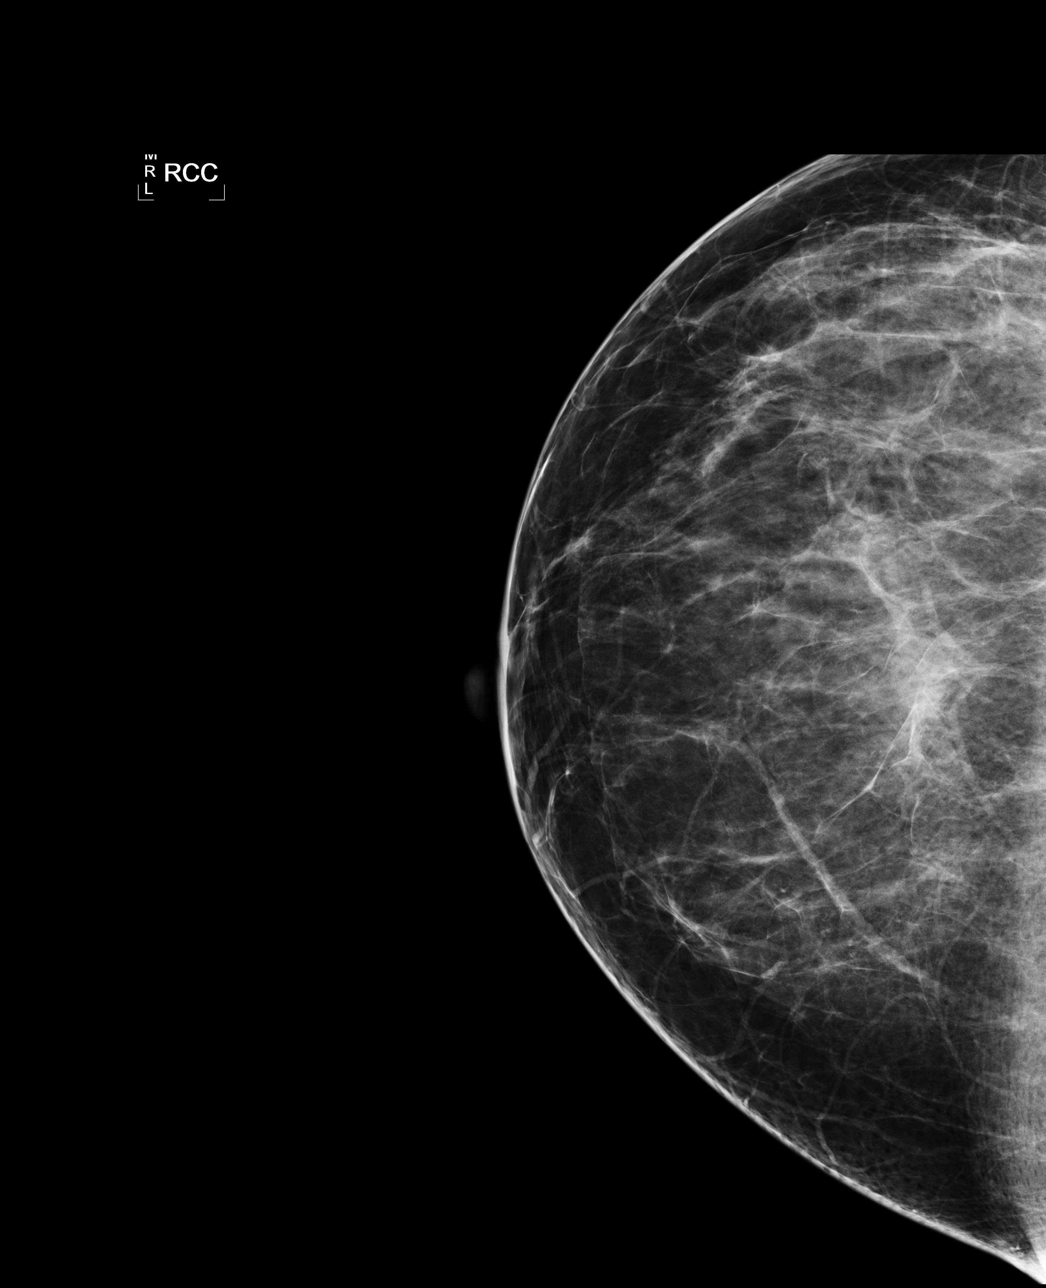

[L CC]
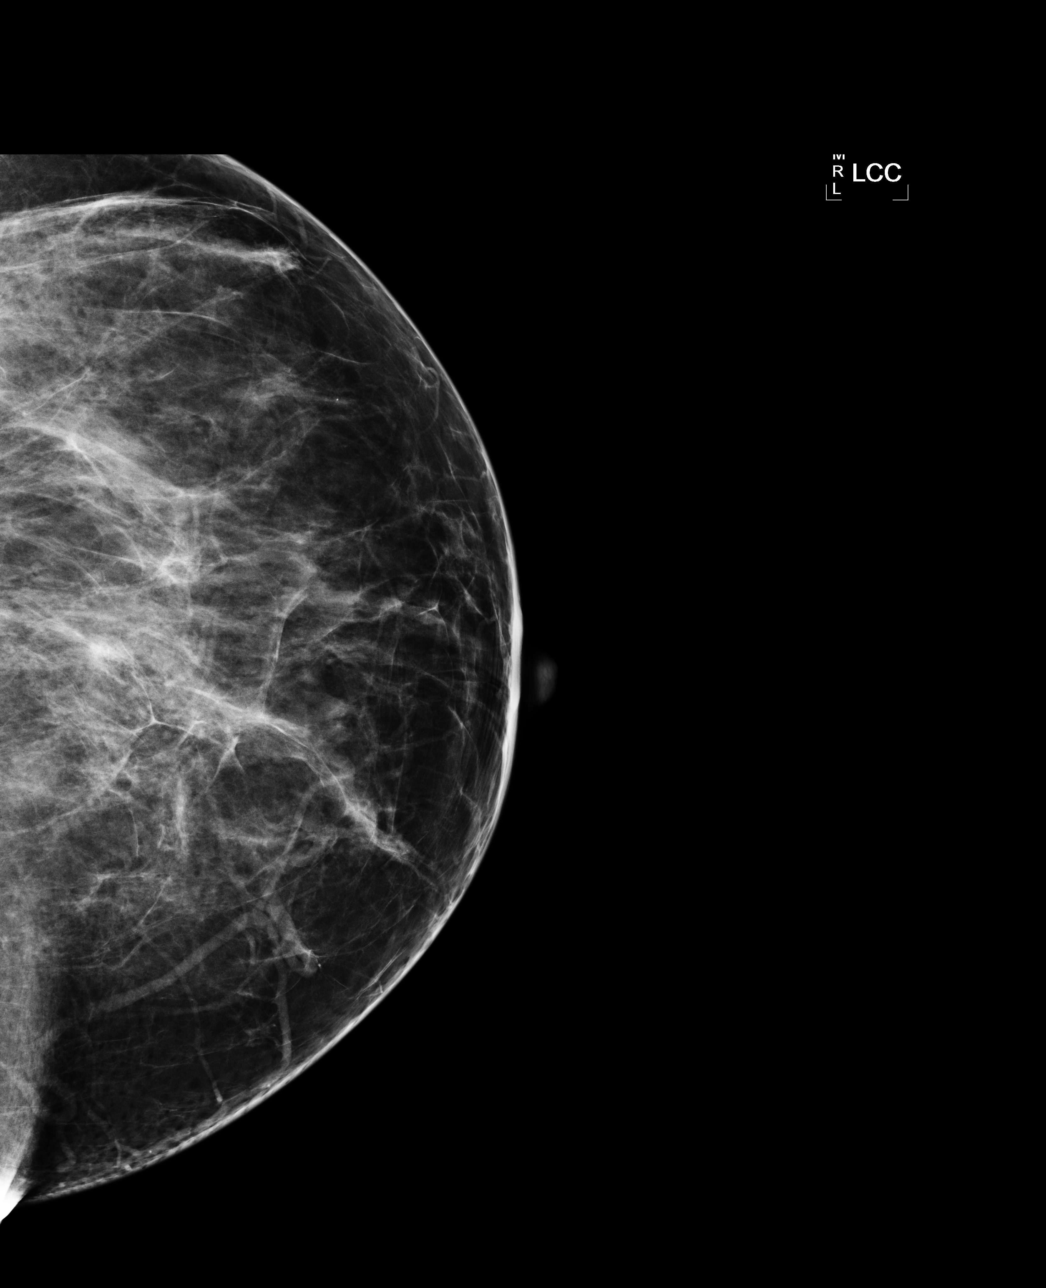

[L MLO]
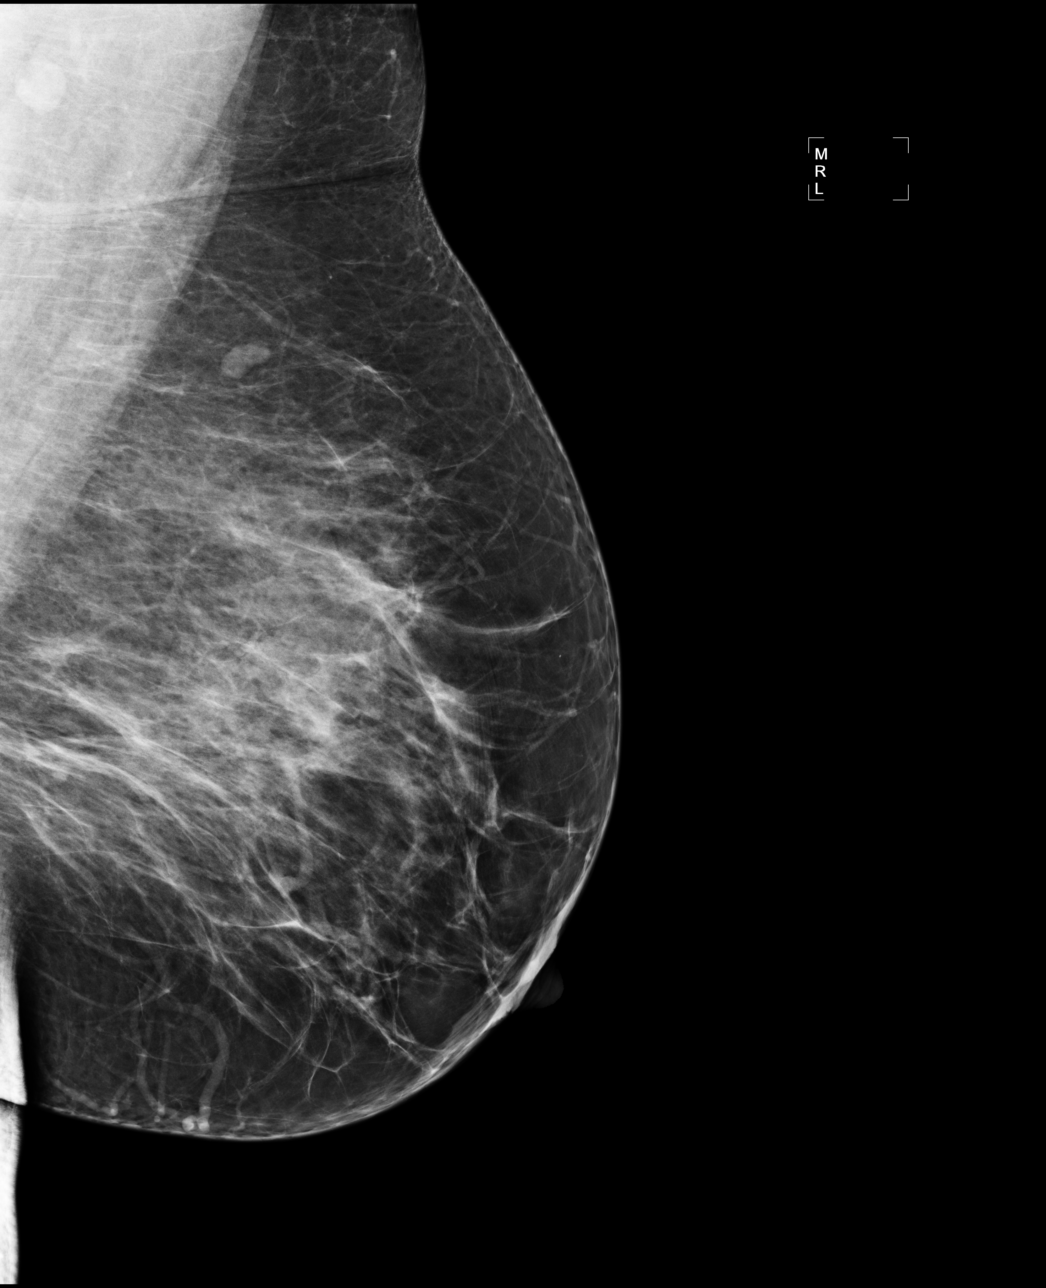

[R MLO]
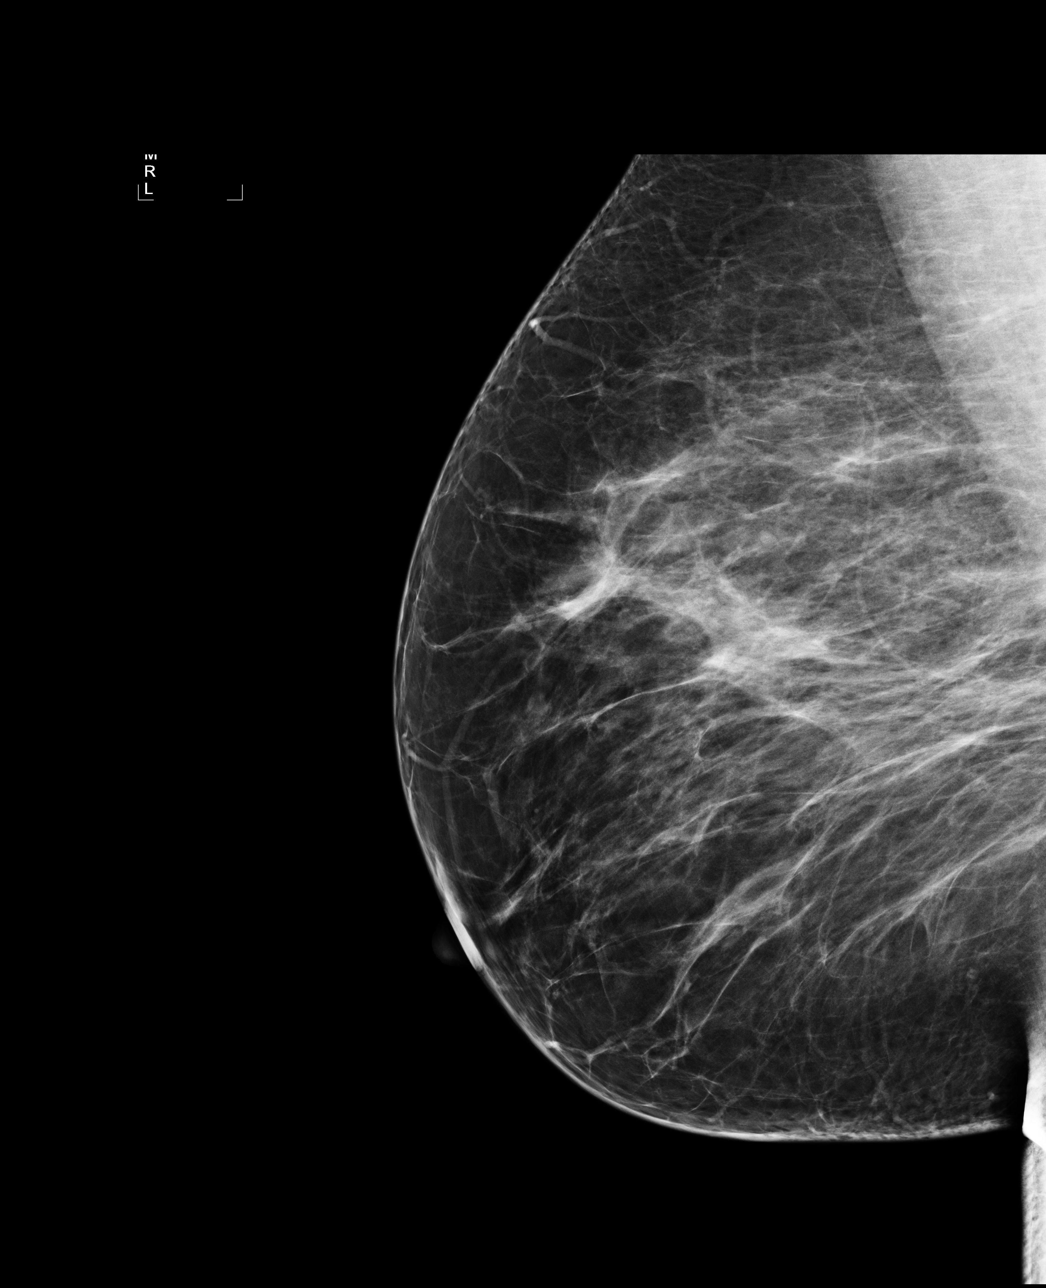

[4 of 4 positions shown; findings below may reference images not displayed]

ACR Breast Density Category c: The breasts are heterogeneously
dense, which may obscure small masses
FINDINGS: There are no findings suspicious for malignancy. Images were
processed with CAD.
IMPRESSION: No mammographic evidence of malignancy. A result letter of this
screening mammogram will be mailed directly to the patient.

RECOMMENDATION:
Screening mammogram in one year. (Code:6Z-[id number])

BI-RADS CATEGORY  1: Negative

## 2015-09-25 ENCOUNTER — Telehealth: Payer: Self-pay | Admitting: Pulmonary Disease

## 2015-09-25 DIAGNOSIS — G4733 Obstructive sleep apnea (adult) (pediatric): Secondary | ICD-10-CM

## 2015-09-25 NOTE — Telephone Encounter (Signed)
Spoke with pt, states she wishes to switch DME companies d/t difficulty with billing-pt uses [facility].  Pt requests to switch to any other dme company in network with her insurance.  VS ok to switch DME providers?  Thanks!

## 2015-09-25 NOTE — Telephone Encounter (Signed)
Okay to send order to switch DMEs.  She will need auto CPAP with range 5 to 15 cm H2O with heated humidity.

## 2015-09-25 NOTE — Telephone Encounter (Signed)
Order placed to switch DME companies.  Nothing further needed.

## 2015-09-26 ENCOUNTER — Emergency Department (HOSPITAL_COMMUNITY)
Admission: EM | Admit: 2015-09-26 | Discharge: 2015-09-27 | Disposition: A | Discharge status: Home / Self Care | Payer: 59 | Attending: Emergency Medicine | Admitting: Emergency Medicine

## 2015-09-26 ENCOUNTER — Emergency Department (HOSPITAL_COMMUNITY): Payer: 59

## 2015-09-26 DIAGNOSIS — R8290 Unspecified abnormal findings in urine: Secondary | ICD-10-CM

## 2015-09-26 DIAGNOSIS — K5900 Constipation, unspecified: Secondary | ICD-10-CM

## 2015-09-26 DIAGNOSIS — R509 Fever, unspecified: Secondary | ICD-10-CM | POA: Diagnosis present

## 2015-09-26 DIAGNOSIS — R000 Tachycardia, unspecified: Secondary | ICD-10-CM | POA: Insufficient documentation

## 2015-09-26 DIAGNOSIS — Z8659 Personal history of other mental and behavioral disorders: Secondary | ICD-10-CM | POA: Insufficient documentation

## 2015-09-26 DIAGNOSIS — Z3202 Encounter for pregnancy test, result negative: Secondary | ICD-10-CM | POA: Diagnosis not present

## 2015-09-26 DIAGNOSIS — Z7951 Long term (current) use of inhaled steroids: Secondary | ICD-10-CM | POA: Diagnosis not present

## 2015-09-26 DIAGNOSIS — F17210 Nicotine dependence, cigarettes, uncomplicated: Secondary | ICD-10-CM | POA: Insufficient documentation

## 2015-09-26 DIAGNOSIS — R05 Cough: Secondary | ICD-10-CM | POA: Diagnosis not present

## 2015-09-26 DIAGNOSIS — B9689 Other specified bacterial agents as the cause of diseases classified elsewhere: Secondary | ICD-10-CM

## 2015-09-26 DIAGNOSIS — N760 Acute vaginitis: Secondary | ICD-10-CM | POA: Diagnosis not present

## 2015-09-26 DIAGNOSIS — N390 Urinary tract infection, site not specified: Secondary | ICD-10-CM | POA: Diagnosis not present

## 2015-09-26 DIAGNOSIS — R350 Frequency of micturition: Secondary | ICD-10-CM

## 2015-09-26 DIAGNOSIS — R059 Cough, unspecified: Secondary | ICD-10-CM

## 2015-09-26 DIAGNOSIS — R102 Pelvic and perineal pain: Secondary | ICD-10-CM

## 2015-09-26 DIAGNOSIS — Z8669 Personal history of other diseases of the nervous system and sense organs: Secondary | ICD-10-CM | POA: Insufficient documentation

## 2015-09-26 DIAGNOSIS — R112 Nausea with vomiting, unspecified: Secondary | ICD-10-CM

## 2015-09-26 DIAGNOSIS — R1030 Lower abdominal pain, unspecified: Secondary | ICD-10-CM

## 2015-09-26 LAB — WET PREP, GENITAL
Sperm: NONE SEEN
TRICH WET PREP: NONE SEEN
YEAST WET PREP: NONE SEEN

## 2015-09-26 LAB — URINALYSIS, ROUTINE W REFLEX MICROSCOPIC
Bilirubin Urine: NEGATIVE
GLUCOSE, UA: NEGATIVE mg/dL
KETONES UR: NEGATIVE mg/dL
LEUKOCYTES UA: NEGATIVE
Nitrite: NEGATIVE
PH: 5.5 (ref 5.0–8.0)
Protein, ur: NEGATIVE mg/dL
Specific Gravity, Urine: 1.025 (ref 1.005–1.030)

## 2015-09-26 LAB — URINE MICROSCOPIC-ADD ON

## 2015-09-26 LAB — COMPREHENSIVE METABOLIC PANEL
ALT: 14 U/L (ref 14–54)
AST: 16 U/L (ref 15–41)
Albumin: 4 g/dL (ref 3.5–5.0)
Alkaline Phosphatase: 67 U/L (ref 38–126)
Anion gap: 9 (ref 5–15)
BILIRUBIN TOTAL: 0.3 mg/dL (ref 0.3–1.2)
BUN: 7 mg/dL (ref 6–20)
CO2: 21 mmol/L — ABNORMAL LOW (ref 22–32)
CREATININE: 0.8 mg/dL (ref 0.44–1.00)
Calcium: 9.3 mg/dL (ref 8.9–10.3)
Chloride: 107 mmol/L (ref 101–111)
GFR calc Af Amer: >60 mL/min (ref >60)
GFR calc non Af Amer: >60 mL/min (ref >60)
GLUCOSE: 102 mg/dL — AB (ref 65–99)
POTASSIUM: 3.8 mmol/L (ref 3.5–5.1)
Sodium: 137 mmol/L (ref 135–145)
TOTAL PROTEIN: 7.3 g/dL (ref 6.5–8.1)

## 2015-09-26 LAB — CBC WITH DIFFERENTIAL/PLATELET
BASOS ABS: 0 10*3/uL (ref 0.0–0.1)
Basophils Relative: 0 %
EOS ABS: 0.1 10*3/uL (ref 0.0–0.7)
EOS PCT: 1 %
HCT: 39 % (ref 36.0–46.0)
Hemoglobin: 13 g/dL (ref 12.0–15.0)
LYMPHS PCT: 10 %
Lymphs Abs: 0.7 10*3/uL (ref 0.7–4.0)
MCH: 31.1 pg (ref 26.0–34.0)
MCHC: 33.3 g/dL (ref 30.0–36.0)
MCV: 93.3 fL (ref 78.0–100.0)
MONO ABS: 1.1 10*3/uL — AB (ref 0.1–1.0)
Monocytes Relative: 15 %
Neutro Abs: 5.2 10*3/uL (ref 1.7–7.7)
Neutrophils Relative %: 74 %
PLATELETS: 260 10*3/uL (ref 150–400)
RBC: 4.18 MIL/uL (ref 3.87–5.11)
RDW: 12.1 % (ref 11.5–15.5)
WBC: 7.1 10*3/uL (ref 4.0–10.5)

## 2015-09-26 LAB — I-STAT TROPONIN, ED: Troponin i, poc: 0 ng/mL (ref 0.00–0.08)

## 2015-09-26 LAB — I-STAT CG4 LACTIC ACID, ED: Lactic Acid, Venous: 0.82 mmol/L (ref 0.5–2.0)

## 2015-09-26 LAB — LIPASE, BLOOD: Lipase: 20 U/L (ref 11–51)

## 2015-09-26 LAB — POC URINE PREG, ED: Preg Test, Ur: NEGATIVE

## 2015-09-26 MED ORDER — SODIUM CHLORIDE 0.9 % IV BOLUS (SEPSIS)
1000.0000 mL | Freq: Once | INTRAVENOUS | Status: AC
  Administered 2015-09-26: 1000 mL via INTRAVENOUS

## 2015-09-26 MED ORDER — MORPHINE SULFATE (PF) 4 MG/ML IV SOLN
4.0000 mg | Freq: Once | INTRAVENOUS | Status: AC
  Administered 2015-09-26: 4 mg via INTRAVENOUS
  Filled 2015-09-26: qty 1

## 2015-09-26 MED ORDER — MORPHINE SULFATE (PF) 4 MG/ML IV SOLN
8.0000 mg | Freq: Once | INTRAVENOUS | Status: AC
  Administered 2015-09-26: 8 mg via INTRAVENOUS
  Filled 2015-09-26: qty 2

## 2015-09-26 MED ORDER — ONDANSETRON HCL 4 MG/2ML IJ SOLN
4.0000 mg | Freq: Once | INTRAMUSCULAR | Status: AC
  Administered 2015-09-26: 4 mg via INTRAVENOUS
  Filled 2015-09-26: qty 2

## 2015-09-26 NOTE — ED Notes (Signed)
UNABLE TO 

## 2015-09-26 NOTE — ED Provider Notes (Signed)
CSN: [id number]     Arrival date & time 09/26/15  2007 History   First MD Initiated Contact with Patient 09/26/15 2125     Chief Complaint  Patient presents with  . Abdominal Pain  . Fever     (Consider location/radiation/quality/duration/timing/severity/associated sxs/prior Treatment) HPI Comments: Heidi Goodman is a 45 y.o. female with a PMHx of PUD (EGD 3 days ago), OSA, depression, and a PSHx of appendectomy, who presents to the ED with complaints of lower abdominal pain. She states that she has had generalized abdominal pain for several weeks, had an EGD 3 days ago which revealed ulcers, and she was sent home with Reglan, omeprazole, dexilant, and sucralfate. She states that today the abdominal pain became more severe in her lower abdomen, describing as 10/10 constant sore aching nonradiating pain worse with eating and unrelieved with Tylenol which she last took 2 hours ago. Associated symptoms include fever with Tmax 100.5, nausea, 2 episodes of nonbloody nonbilious emesis, urinary frequency, and malodorous urine all which started this afternoon. She also states she has had a cough with clear sputum production since yesterday. She does not think she had a Foley catheter during her procedure, but she is not sure.  She denies any rhinorrhea, sore throat, ear pain or drainage, wheezing, chest pain, shortness breath, diarrhea, constipation, melena, hematochezia, hematemesis, obstipation, hematuria, dysuria, flank pain, vaginal bleeding or discharge, numbness, tingling, weakness, recent travel, sick contacts, suspicious food intake, alcohol use, or NSAID use. She is an IUD in place therefore she does not have menses. She is sexually active with 1 partner, her husband, unprotected.  Patient is a 45 y.o. female presenting with abdominal pain and fever. The history is provided by the patient. No language interpreter was used.  Abdominal Pain Pain location:  Generalized (generalized x several weeks,  worsening lower abd pain today) Pain quality: aching   Pain radiates to:  Does not radiate Pain severity:  Severe Onset quality:  Gradual Duration: several weeks, worse today. Timing:  Constant Progression:  Worsening Chronicity:  New Context: previous surgery   Context: not recent travel, not sick contacts and not suspicious food intake   Relieved by:  Nothing Worsened by:  Eating Ineffective treatments:  Acetaminophen Associated symptoms: cough, fever, nausea and vomiting   Associated symptoms: no chest pain, no constipation, no diarrhea, no dysuria, no flatus, no hematemesis, no hematochezia, no hematuria, no melena, no shortness of breath, no sore throat, no vaginal bleeding and no vaginal discharge   Risk factors: recent hospitalization   Risk factors: no alcohol abuse and no NSAID use   Fever Associated symptoms: cough, nausea and vomiting   Associated symptoms: no chest pain, no confusion, no diarrhea, no dysuria, no ear pain, no myalgias, no rhinorrhea and no sore throat     Past Medical History  Diagnosis Date  . Depression 11/12/2013  . OSA (obstructive sleep apnea) 11/20/2013   Past Surgical History  Procedure Laterality Date  . Appendectomy    . Foot surgery     Family History  Problem Relation Age of Onset  . Hypertension Mother   . Hyperlipidemia Mother   . Heart disease Mother 21    CAD, MI  . Hypertension Maternal Grandmother    Social History  Substance Use Topics  . Smoking status: Current Every Day Smoker -- 0.10 packs/day for 18 years    Types: Cigarettes    Last Attempt to Quit: 02/13/2014  . Smokeless tobacco: Never Used  Comment: 1 pack per week QUIT 02/13/14-- Uses VAPOR cig daily  . Alcohol Use: No   OB History    No data available     Review of Systems  Constitutional: Positive for fever.  HENT: Negative for ear discharge, ear pain, rhinorrhea and sore throat.   Respiratory: Positive for cough. Negative for shortness of breath and  wheezing.   Cardiovascular: Negative for chest pain.  Gastrointestinal: Positive for nausea, vomiting and abdominal pain. Negative for diarrhea, constipation, blood in stool, melena, hematochezia, flatus and hematemesis.  Genitourinary: Positive for urgency and frequency. Negative for dysuria, hematuria, flank pain, vaginal bleeding and vaginal discharge.       +malodorous urine  Musculoskeletal: Negative for myalgias and arthralgias.  Skin: Negative for color change.  Allergic/Immunologic: Negative for immunocompromised state.  Neurological: Negative for weakness and numbness.  Psychiatric/Behavioral: Negative for confusion.   10 Systems reviewed and are negative for acute change except as noted in the HPI.    Allergies  Ivp dye and Shellfish allergy  Home Medications   Prior to Admission medications   Medication Sig Start Date End Date Taking? Authorizing Provider  cetirizine (ZYRTEC) 10 MG tablet Take 1 tablet (10 mg total) by mouth daily as needed for allergies or rhinitis. 04/17/14   Coralyn Helling, MD  fluticasone (FLONASE) 50 MCG/ACT nasal spray Place 2 sprays into both nostrils daily. 11/12/13   Terressa Koyanagi, DO   BP 112/94 mmHg  Pulse 114  Temp(Src) 100.4 F (38 C) (Oral)  Resp 20  SpO2 100% Physical Exam  Constitutional: She is oriented to person, place, and time. She appears well-developed and well-nourished.  Non-toxic appearance. No distress.  Low-grade temp 100.4, mildly tachycardic, nontoxic, NAD, overall well appearing  HENT:  Head: Normocephalic and atraumatic.  Mouth/Throat: Oropharynx is clear and moist and mucous membranes are normal.  Eyes: Conjunctivae and EOM are normal. Right eye exhibits no discharge. Left eye exhibits no discharge.  Neck: Normal range of motion. Neck supple.  Cardiovascular: Regular rhythm, normal heart sounds and intact distal pulses.  Tachycardia present.  Exam reveals no gallop and no friction rub.   No murmur heard. Tachycardic in the  110s, reg rhythm, nl s1/s2, no m/r/g, distal pulses intact, no pedal edema  Pulmonary/Chest: Effort normal and breath sounds normal. No respiratory distress. She has no decreased breath sounds. She has no wheezes. She has no rhonchi. She has no rales.  CTAB in all lung fields, no w/r/r, no hypoxia or increased WOB, speaking in full sentences, SpO2 100% on RA   Abdominal: Soft. Normal appearance and bowel sounds are normal. She exhibits no distension. There is generalized tenderness. There is no rigidity, no rebound, no guarding, no CVA tenderness, no tenderness at McBurney's point and negative Murphy's sign.  Soft, obese but nondistended, +BS throughout, with generalized abd tenderness but most focally in the suprapubic area, no r/g/r, neg murphy's, neg mcburney's, no CVA TTP   Genitourinary: Uterus normal. Pelvic exam was performed with patient supine. There is no rash, tenderness or lesion on the right labia. There is no rash, tenderness or lesion on the left labia. Cervix exhibits discharge and friability. Cervix exhibits no motion tenderness. Right adnexum displays no mass, no tenderness and no fullness. Left adnexum displays tenderness and fullness. Left adnexum displays no mass. No erythema, tenderness or bleeding in the vagina. Vaginal discharge found.  Chaperone present for exam. No rashes, lesions, or tenderness to external genitalia. No erythema, injury, or tenderness to vaginal mucosa.  Scant mucoid vaginal discharge without bleeding within vaginal vault. No adnexal masses, with mild L sided adnexal tenderness and fullness, R side without tenderness or fullness. No CMT, +cervical friability, with scant thin mucoid discharge from cervical os. Cervical os is closed. Unable to visualize strings of IUD due to cervix being very retroflexed. Uterus non-deviated, mobile, nonTTP, and without enlargement.    Musculoskeletal: Normal range of motion.  Neurological: She is alert and oriented to person, place,  and time. She has normal strength. No sensory deficit.  Skin: Skin is warm, dry and intact. No rash noted.  Psychiatric: She has a normal mood and affect.  Nursing note and vitals reviewed.   ED Course  Procedures (including critical care time) Labs Review Labs Reviewed  WET PREP, GENITAL - Abnormal; Notable for the following:    Clue Cells Wet Prep HPF POC PRESENT (*)    WBC, Wet Prep HPF POC MANY (*)    All other components within normal limits  COMPREHENSIVE METABOLIC PANEL - Abnormal; Notable for the following:    CO2 21 (*)    Glucose, Bld 102 (*)    All other components within normal limits  CBC WITH DIFFERENTIAL/PLATELET - Abnormal; Notable for the following:    Monocytes Absolute 1.1 (*)    All other components within normal limits  URINALYSIS, ROUTINE W REFLEX MICROSCOPIC (NOT AT [facility]) - Abnormal; Notable for the following:    APPearance CLOUDY (*)    Hgb urine dipstick MODERATE (*)    All other components within normal limits  URINE MICROSCOPIC-ADD ON - Abnormal; Notable for the following:    Squamous Epithelial / LPF 0-5 (*)    Bacteria, UA RARE (*)    All other components within normal limits  URINE CULTURE  LIPASE, BLOOD  I-STAT CG4 LACTIC ACID, ED  I-STAT TROPOININ, ED  POC URINE PREG, ED  GC/CHLAMYDIA PROBE AMP ([place]) NOT AT [facility]    Imaging Review Dg Abd Acute W/chest  09/26/2015  CLINICAL DATA:  Cough and fever for 1 day. Abdominal pain. Recent endoscopy. EXAM: DG ABDOMEN ACUTE W/ 1V CHEST COMPARISON:  Chest CT 04/29/2013 FINDINGS: The cardiomediastinal contours are normal. The lungs are clear. No consolidation. There is no free intra-abdominal air. No dilated bowel loops to suggest obstruction. Moderate stool burden air throughout the colon without colonic distention. No radiopaque calculi. An IUD is in the pelvis. No acute osseous abnormalities are seen. Scoliotic curvature of the lower thoracic and lumbar spine. IMPRESSION: 1. Clear lungs.  No  pneumonia. 2. Normal bowel gas pattern with moderate stool burden. No bowel obstruction or free air. Electronically Signed   By: Rubye Oaks M.D.   On: 09/26/2015 22:26   I have personally reviewed and evaluated these images and lab results as part of my medical decision-making.   EKG Interpretation   Date/Time:  Saturday September 26 2015 21:51:06 EST Ventricular Rate:  87 PR Interval:  137 QRS Duration: 102 QT Interval:  337 QTC Calculation: 405 R Axis:   -4 Text Interpretation:  Sinus rhythm Abnormal R-wave progression, early  transition Left ventricular hypertrophy Nonspecific T abnormalities,  anterior leads similar to previous from 04/2013 Confirmed by KOHUT  MD,  STEPHEN ([id number]) on 09/26/2015 11:10:28 PM      MDM   Final diagnoses:  Left adnexal tenderness  Lower abdominal pain  Non-intractable vomiting with nausea, vomiting of unspecified type  Other specified fever  Cough  Urinary frequency  Malodorous urine  UTI (lower urinary tract  infection)  BV (bacterial vaginosis)  Constipation, unspecified constipation type    45 y.o. female here with abd pain, n/v, fever, urinary freq, malodorous urine, and cough x1 day. Had generalized abd pain for wks, had EGD done 3 days ago which showed ulcers. Today she developed low-grade fevers and urinary issues, and n/v. Cough developed yesterday with clear sputum. On exam, generalized abd tenderness throughout with most focal pain in suprapubic area, nonperitoneal. Clear lung exam. Temp 100.4 and mildly tachycardic but overall well appearing, doesn't appear septic. Will obtain acute abd series to eval cough/PNA as well as perf/obstruction, will also obtain labs, EKG, trop, lactic, and give pain meds, nausea meds, and fluids. Will hold off on pelvic exam for now, if urine doesn't reveal UTI as a source then may consider pelvic exam. Will reassess shortly.   10:35 PM Upreg neg, U/A cloudy with moderate Hgb and 0-5 squamous and WBC, but  rare bacteria, not a convincing sample for UTI although given symptoms will still likely treat since she has the urinary frequency/urgency and malodorous urine. Will send for culture, and will proceed with pelvic exam to ensure we're not missing anything else. Acute abd series showing clear lungs and no free air, moderate stool burden so this could be constipation-related pain. EKG with some nonspecific T waves and LVH but no other findings and similar to prior EKGs. Pt just now getting nausea meds and pain meds. Will reassess shortly. Of note, HR improved to 90s with fluids. Other labs still not drawn, will await these as well.   11:43 PM Pelvic exam with L adnexal tenderness, some fullness, mild discharge in vaginal vault. Will obtain pelvic U/S to further eval the pain, and possible TOA vs torsion vs cyst vs other etiology. Of note, lactic WNL, trop neg, CMP WNL, lipase WNL, CBC WNL. Pain still high, will give more pain meds. Nausea improved. Will reassess shortly.   12:57 AM U/S still in process. Wet prep with +clue cells, will treat for BV. Also with many WBCs, but pt without significant findings on pelvic exam to be concerned for GC/CT, will hold off on empiric tx at this time. Will treat UTI with bactrim, and BV with flagyl, and start on miralax for constipation. High fiber diet discussed, importance of hydration discussed. Tylenol for fever, will give narcotic for additional pain relief but discussed using sparingly as this will cause worsening constipation. OTC meds for cough, no evidence of PNA, cough likely from her recent EGD. Will send home with zofran as well, in addition to her reglan, since this worked well here. Discussed case with Elpidio Anis PA-C who will inform pt of these instructions, and f/up with u/s results. Likely d/c home as long as U/S doesn't show any acute findings like TOA/torsion/etc. Could like go home with close f/up with PCP in 3-5 days for recheck of symptoms. Please see  Eppie Gibson notes for further documentation of results/care/dispo. Prescriptions printed and left with Melvenia Beam.   BP 112/94 mmHg  Pulse 114  Temp(Src) 100.4 F (38 C) (Oral)  Resp 20  SpO2 100%  Meds ordered this encounter  Medications  . sodium chloride 0.9 % bolus 1,000 mL    Sig:   . morphine 4 MG/ML injection 4 mg    Sig:   . ondansetron (ZOFRAN) injection 4 mg    Sig:   . morphine 4 MG/ML injection 8 mg    Sig:   . ondansetron (ZOFRAN ODT) 8 MG disintegrating tablet  Sig: Take 1 tablet (8 mg total) by mouth every 8 (eight) hours as needed for nausea or vomiting.    Dispense:  10 tablet    Refill:  0  . polyethylene glycol (MIRALAX / GLYCOLAX) packet    Sig: Take 17 g by mouth daily.    Dispense:  14 each    Refill:  0  . HYDROcodone-acetaminophen (NORCO) 5-325 MG tablet    Sig: Take 1 tablet by mouth every 6 (six) hours as needed for severe pain.    Dispense:  6 tablet    Refill:  0  . metroNIDAZOLE (FLAGYL) 500 MG tablet    Sig: Take 1 tablet (500 mg total) by mouth 2 (two) times daily. One po bid x 7 days    Dispense:  14 tablet    Refill:  0  . sulfamethoxazole-trimethoprim (BACTRIM DS,SEPTRA DS) 800-160 MG tablet    Sig: Take 1 tablet by mouth 2 (two) times daily.    Dispense:  14 tablet    Refill:  0     Lucah Petta Camprubi-Soms, PA-C 09/27/15 0059  Raeford Razor, MD 10/01/15 [phone]

## 2015-09-26 NOTE — ED Notes (Signed)
Pt was started on new medicaitons Metoclopramide 10 mg  ,  Sucralfate 1 gm and Omeprazole and Dexilant

## 2015-09-26 NOTE — ED Notes (Signed)
Pelvic cart at bedside. 

## 2015-09-26 NOTE — ED Notes (Signed)
Severe abdominal pain and fever,  Pt had an endoscopy on Wednesday at [facility] center and diagnosed with ulcers

## 2015-09-26 NOTE — ED Notes (Addendum)
Pt presents with lower abdominal pain and nausea with 2 episodes of emesis in route here (none previously). Pt states her pain is 10/10 in her lower abdomen. Pt reports she has had an increase in urinary frequency and has had foul smelling urine. Pt also has c/o a cough that started yesterday. The cough is productive and she produces clear sputum.   Pt also has c/o rib pain that is worse when she coughs.

## 2015-09-26 NOTE — ED Notes (Signed)
UNABLE TO COLLECT LABS AT THIS TIME PATIENT IS NOT IN HER ROOM

## 2015-09-26 NOTE — ED Notes (Signed)
Pt to xray

## 2015-09-27 ENCOUNTER — Emergency Department (HOSPITAL_COMMUNITY): Payer: 59

## 2015-09-27 MED ORDER — SULFAMETHOXAZOLE-TRIMETHOPRIM 800-160 MG PO TABS: 1.0000 | ORAL_TABLET | Freq: Two times a day (BID) | ORAL | Status: DC

## 2015-09-27 MED ORDER — METRONIDAZOLE 500 MG PO TABS: 500.0000 mg | ORAL_TABLET | Freq: Two times a day (BID) | ORAL | Status: DC

## 2015-09-27 MED ORDER — POLYETHYLENE GLYCOL 3350 17 G PO PACK: 17.0000 g | PACK | Freq: Every day | ORAL | Status: DC

## 2015-09-27 MED ORDER — ONDANSETRON 8 MG PO TBDP: 8.0000 mg | ORAL_TABLET | Freq: Three times a day (TID) | ORAL | Status: DC | PRN

## 2015-09-27 MED ORDER — HYDROCODONE-ACETAMINOPHEN 5-325 MG PO TABS: 1.0000 | ORAL_TABLET | Freq: Four times a day (QID) | ORAL | Status: DC | PRN

## 2015-09-27 NOTE — Discharge Instructions (Signed)
Your vaginal swabs revealed bacterial vaginosis, take flagyl as directed, avoid drinking alcohol while taking this medication. Use zofran or home reglan as needed for nausea. Stay well hydrated with plenty of water. You have been tested for gonorrhea and chlamydia in the ER, the hospital will call you if lab is positive. You may have a urinary tract infection, since you have some symptoms and some findings on your urine specimen. Take antibiotic until completed. Use tylenol as needed for pain or fever, using norco for severe pain but don't drive while taking norco. Start on miralax to have daily soft bowel movements, eat a high fiber diet and drink plenty of water to help with constipation. May consider over the counter mucinex for cough. Follow up with your primary care physician in 1 week for recheck of ongoing symptoms but return to ER for emergent changing or worsening of symptoms. Please seek immediate care if you develop the following: You develop back pain.  Your symptoms are no better, or worse in 3 days. There is severe back pain or lower abdominal pain.  You develop chills.  You have a fever.  There is nausea or vomiting.  There is continued burning or discomfort with urination.    Abdominal Pain, Adult Many things can cause belly (abdominal) pain. Most times, the belly pain is not dangerous. Many cases of belly pain can be watched and treated at home. HOME CARE   Do not take medicines that help you go poop (laxatives) unless told to by your doctor.  Only take medicine as told by your doctor.  Eat or drink as told by your doctor. Your doctor will tell you if you should be on a special diet. GET HELP IF:  You do not know what is causing your belly pain.  You have belly pain while you are sick to your stomach (nauseous) or have runny poop (diarrhea).  You have pain while you pee or poop.  Your belly pain wakes you up at night.  You have belly pain that gets worse or better when  you eat.  You have belly pain that gets worse when you eat fatty foods.  You have a fever. GET HELP RIGHT AWAY IF:   The pain does not go away within 2 hours.  You keep throwing up (vomiting).  The pain changes and is only in the right or left part of the belly.  You have bloody or tarry looking poop. MAKE SURE YOU:   Understand these instructions.  Will watch your condition.  Will get help right away if you are not doing well or get worse.   This information is not intended to replace advice given to you by your health care provider. Make sure you discuss any questions you have with your health care provider.   Document Released: 01/11/2008 Document Revised: 08/15/2014 Document Reviewed: 04/03/2013 Elsevier Interactive Patient Education 2016 [employer].   Constipation, Adult Constipation is when a person:  Poops (has a bowel movement) less than 3 times a week.  Has a hard time pooping.  Has poop that is dry, hard, or bigger than normal. HOME CARE   Eat foods with a lot of fiber in them. This includes fruits, vegetables, beans, and whole grains such as brown rice.  Avoid fatty foods and foods with a lot of sugar. This includes french fries, hamburgers, cookies, candy, and soda.  If you are not getting enough fiber from food, take products with added fiber in them (supplements).  Drink enough fluid to keep your pee (urine) clear or pale yellow.  Exercise on a regular basis, or as told by your doctor.  Go to the restroom when you feel like you need to poop. Do not hold it.  Only take medicine as told by your doctor. Do not take medicines that help you poop (laxatives) without talking to your doctor first. GET HELP RIGHT AWAY IF:   You have bright red blood in your poop (stool).  Your constipation lasts more than 4 days or gets worse.  You have belly (abdominal) or butt (rectal) pain.  You have thin poop (as thin as a pencil).  You lose weight, and it  cannot be explained. MAKE SURE YOU:   Understand these instructions.  Will watch your condition.  Will get help right away if you are not doing well or get worse.   This information is not intended to replace advice given to you by your health care provider. Make sure you discuss any questions you have with your health care provider.   Document Released: 01/11/2008 Document Revised: 08/15/2014 Document Reviewed: 05/06/2013 Elsevier Interactive Patient Education 2016 Elsevier Inc.  High-Fiber Diet Fiber, also called dietary fiber, is a type of carbohydrate found in fruits, vegetables, whole grains, and beans. A high-fiber diet can have many health benefits. Your health care provider may recommend a high-fiber diet to help:  Prevent constipation. Fiber can make your bowel movements more regular.  Lower your cholesterol.  Relieve hemorrhoids, uncomplicated diverticulosis, or irritable bowel syndrome.  Prevent overeating as part of a weight-loss plan.  Prevent heart disease, type 2 diabetes, and certain cancers. WHAT IS MY PLAN? The recommended daily intake of fiber includes:  38 grams for men under age 41.  30 grams for men over age 75.  25 grams for women under age 86.  21 grams for women over age 73. You can get the recommended daily intake of dietary fiber by eating a variety of fruits, vegetables, grains, and beans. Your health care provider may also recommend a fiber supplement if it is not possible to get enough fiber through your diet. WHAT DO I NEED TO KNOW ABOUT A HIGH-FIBER DIET?  Fiber supplements have not been widely studied for their effectiveness, so it is better to get fiber through food sources.  Always check the fiber content on thenutrition facts label of any prepackaged food. Look for foods that contain at least 5 grams of fiber per serving.  Ask your dietitian if you have questions about specific foods that are related to your condition, especially if  those foods are not listed in the following section.  Increase your daily fiber consumption gradually. Increasing your intake of dietary fiber too quickly may cause bloating, cramping, or gas.  Drink plenty of water. Water helps you to digest fiber. WHAT FOODS CAN I EAT? Grains Whole-grain breads. Multigrain cereal. Oats and oatmeal. Brown rice. Barley. Bulgur wheat. Millet. Bran muffins. Popcorn. Rye wafer crackers. Vegetables Sweet potatoes. Spinach. Kale. Artichokes. Cabbage. Broccoli. Green peas. Carrots. Squash. Fruits Berries. Pears. Apples. Oranges. Avocados. Prunes and raisins. Dried figs. Meats and Other Protein Sources Navy, kidney, pinto, and soy beans. Split peas. Lentils. Nuts and seeds. Dairy Fiber-fortified yogurt. Beverages Fiber-fortified soy milk. Fiber-fortified orange juice. Other Fiber bars. The items listed above may not be a complete list of recommended foods or beverages. Contact your dietitian for more options. WHAT FOODS ARE NOT RECOMMENDED? Grains White bread. Pasta made with refined flour. White rice. Vegetables  Fried potatoes. Canned vegetables. Well-cooked vegetables.  Fruits Fruit juice. Cooked, strained fruit. Meats and Other Protein Sources Fatty cuts of meat. Fried Environmental education officer or fried fish. Dairy Milk. Yogurt. Cream cheese. Sour cream. Beverages Soft drinks. Other Cakes and pastries. Butter and oils. The items listed above may not be a complete list of foods and beverages to avoid. Contact your dietitian for more information. WHAT ARE SOME TIPS FOR INCLUDING HIGH-FIBER FOODS IN MY DIET?  Eat a wide variety of high-fiber foods.  Make sure that half of all grains consumed each day are whole grains.  Replace breads and cereals made from refined flour or white flour with whole-grain breads and cereals.  Replace white rice with brown rice, bulgur wheat, or millet.  Start the day with a breakfast that is high in fiber, such as a cereal that  contains at least 5 grams of fiber per serving.  Use beans in place of meat in soups, salads, or pasta.  Eat high-fiber snacks, such as berries, raw vegetables, nuts, or popcorn.   This information is not intended to replace advice given to you by your health care provider. Make sure you discuss any questions you have with your health care provider.   Document Released: 07/25/2005 Document Revised: 08/15/2014 Document Reviewed: 01/07/2014 Elsevier Interactive Patient Education 2016 Elsevier Inc.  Cough, Adult Coughing is a reflex that clears your throat and your airways. Coughing helps to heal and protect your lungs. It is normal to cough occasionally, but a cough that happens with other symptoms or lasts a long time may be a sign of a condition that needs treatment. A cough may last only 2-3 weeks (acute), or it may last longer than 8 weeks (chronic). CAUSES Coughing is commonly caused by:  Breathing in substances that irritate your lungs.  A viral or bacterial respiratory infection.  Allergies.  Asthma.  Postnasal drip.  Smoking.  Acid backing up from the stomach into the esophagus (gastroesophageal reflux).  Certain medicines.  Chronic lung problems, including COPD (or rarely, lung cancer).  Other medical conditions such as heart failure. HOME CARE INSTRUCTIONS  Pay attention to any changes in your symptoms. Take these actions to help with your discomfort:  Take medicines only as told by your health care provider.  If you were prescribed an antibiotic medicine, take it as told by your health care provider. Do not stop taking the antibiotic even if you start to feel better.  Talk with your health care provider before you take a cough suppressant medicine.  Drink enough fluid to keep your urine clear or pale yellow.  If the air is dry, use a cold steam vaporizer or humidifier in your bedroom or your home to help loosen secretions.  Avoid anything that causes you to  cough at work or at home.  If your cough is worse at night, try sleeping in a semi-upright position.  Avoid cigarette smoke. If you smoke, quit smoking. If you need help quitting, ask your health care provider.  Avoid caffeine.  Avoid alcohol.  Rest as needed. SEEK MEDICAL CARE IF:   You have new symptoms.  You cough up pus.  Your cough does not get better after 2-3 weeks, or your cough gets worse.  You cannot control your cough with suppressant medicines and you are losing sleep.  You develop pain that is getting worse or pain that is not controlled with pain medicines.  You have a fever.  You have unexplained weight loss.  You have  night sweats. SEEK IMMEDIATE MEDICAL CARE IF:  You cough up blood.  You have difficulty breathing.  Your heartbeat is very fast.   This information is not intended to replace advice given to you by your health care provider. Make sure you discuss any questions you have with your health care provider.   Document Released: 01/21/2011 Document Revised: 04/15/2015 Document Reviewed: 10/01/2014 Elsevier Interactive Patient Education 2016 Elsevier Inc.  Nausea and Vomiting Nausea is a sick feeling that often comes before throwing up (vomiting). Vomiting is a reflex where stomach contents come out of your mouth. Vomiting can cause severe loss of body fluids (dehydration). Children and elderly adults can become dehydrated quickly, especially if they also have diarrhea. Nausea and vomiting are symptoms of a condition or disease. It is important to find the cause of your symptoms. CAUSES   Direct irritation of the stomach lining. This irritation can result from increased acid production (gastroesophageal reflux disease), infection, food poisoning, taking certain medicines (such as nonsteroidal anti-inflammatory drugs), alcohol use, or tobacco use.  Signals from the brain.These signals could be caused by a headache, heat exposure, an inner ear  disturbance, increased pressure in the brain from injury, infection, a tumor, or a concussion, pain, emotional stimulus, or metabolic problems.  An obstruction in the gastrointestinal tract (bowel obstruction).  Illnesses such as diabetes, hepatitis, gallbladder problems, appendicitis, kidney problems, cancer, sepsis, atypical symptoms of a heart attack, or eating disorders.  Medical treatments such as chemotherapy and radiation.  Receiving medicine that makes you sleep (general anesthetic) during surgery. DIAGNOSIS Your caregiver may ask for tests to be done if the problems do not improve after a few days. Tests may also be done if symptoms are severe or if the reason for the nausea and vomiting is not clear. Tests may include:  Urine tests.  Blood tests.  Stool tests.  Cultures (to look for evidence of infection).  X-rays or other imaging studies. Test results can help your caregiver make decisions about treatment or the need for additional tests. TREATMENT You need to stay well hydrated. Drink frequently but in small amounts.You may wish to drink water, sports drinks, clear broth, or eat frozen ice pops or gelatin dessert to help stay hydrated.When you eat, eating slowly may help prevent nausea.There are also some antinausea medicines that may help prevent nausea. HOME CARE INSTRUCTIONS   Take all medicine as directed by your caregiver.  If you do not have an appetite, do not force yourself to eat. However, you must continue to drink fluids.  If you have an appetite, eat a normal diet unless your caregiver tells you differently.  Eat a variety of complex carbohydrates (rice, wheat, potatoes, bread), lean meats, yogurt, fruits, and vegetables.  Avoid high-fat foods because they are more difficult to digest.  Drink enough water and fluids to keep your urine clear or pale yellow.  If you are dehydrated, ask your caregiver for specific rehydration instructions. Signs of  dehydration may include:  Severe thirst.  Dry lips and mouth.  Dizziness.  Dark urine.  Decreasing urine frequency and amount.  Confusion.  Rapid breathing or pulse. SEEK IMMEDIATE MEDICAL CARE IF:   You have blood or brown flecks (like coffee grounds) in your vomit.  You have black or bloody stools.  You have a severe headache or stiff neck.  You are confused.  You have severe abdominal pain.  You have chest pain or trouble breathing.  You do not urinate at least once every  8 hours.  You develop cold or clammy skin.  You continue to vomit for longer than 24 to 48 hours.  You have a fever. MAKE SURE YOU:   Understand these instructions.  Will watch your condition.  Will get help right away if you are not doing well or get worse.   This information is not intended to replace advice given to you by your health care provider. Make sure you discuss any questions you have with your health care provider.   Document Released: 07/25/2005 Document Revised: 10/17/2011 Document Reviewed: 12/22/2010 Elsevier Interactive Patient Education 2016 Elsevier Inc.  Urinary Tract Infection A urinary tract infection (UTI) can occur any place along the urinary tract. The tract includes the kidneys, ureters, bladder, and urethra. A type of germ called bacteria often causes a UTI. UTIs are often helped with antibiotic medicine.  HOME CARE   If given, take antibiotics as told by your doctor. Finish them even if you start to feel better.  Drink enough fluids to keep your pee (urine) clear or pale yellow.  Avoid tea, drinks with caffeine, and bubbly (carbonated) drinks.  Pee often. Avoid holding your pee in for a long time.  Pee before and after having sex (intercourse).  Wipe from front to back after you poop (bowel movement) if you are a woman. Use each tissue only once. GET HELP RIGHT AWAY IF:   You have back pain.  You have lower belly (abdominal) pain.  You have  chills.  You feel sick to your stomach (nauseous).  You throw up (vomit).  Your burning or discomfort with peeing does not go away.  You have a fever.  Your symptoms are not better in 3 days. MAKE SURE YOU:   Understand these instructions.  Will watch your condition.  Will get help right away if you are not doing well or get worse.   This information is not intended to replace advice given to you by your health care provider. Make sure you discuss any questions you have with your health care provider.   Document Released: 01/11/2008 Document Revised: 08/15/2014 Document Reviewed: 02/23/2012 Elsevier Interactive Patient Education 2016 Elsevier Inc.  Bacterial Vaginosis Bacterial vaginosis is a vaginal infection that occurs when the normal balance of bacteria in the vagina is disrupted. It results from an overgrowth of certain bacteria. This is the most common vaginal infection in women of childbearing age. Treatment is important to prevent complications, especially in pregnant women, as it can cause a premature delivery. CAUSES  Bacterial vaginosis is caused by an increase in harmful bacteria that are normally present in smaller amounts in the vagina. Several different kinds of bacteria can cause bacterial vaginosis. However, the reason that the condition develops is not fully understood. RISK FACTORS Certain activities or behaviors can put you at an increased risk of developing bacterial vaginosis, including:  Having a new sex partner or multiple sex partners.  Douching.  Using an intrauterine device (IUD) for contraception. Women do not get bacterial vaginosis from toilet seats, bedding, swimming pools, or contact with objects around them. SIGNS AND SYMPTOMS  Some women with bacterial vaginosis have no signs or symptoms. Common symptoms include:  Grey vaginal discharge.  A fishlike odor with discharge, especially after sexual intercourse.  Itching or burning of the  vagina and vulva.  Burning or pain with urination. DIAGNOSIS  Your health care provider will take a medical history and examine the vagina for signs of bacterial vaginosis. A sample of vaginal fluid  may be taken. Your health care provider will look at this sample under a microscope to check for bacteria and abnormal cells. A vaginal pH test may also be done.  TREATMENT  Bacterial vaginosis may be treated with antibiotic medicines. These may be given in the form of a pill or a vaginal cream. A second round of antibiotics may be prescribed if the condition comes back after treatment. Because bacterial vaginosis increases your risk for sexually transmitted diseases, getting treated can help reduce your risk for chlamydia, gonorrhea, HIV, and herpes. HOME CARE INSTRUCTIONS   Only take over-the-counter or prescription medicines as directed by your health care provider.  If antibiotic medicine was prescribed, take it as directed. Make sure you finish it even if you start to feel better.  Tell all sexual partners that you have a vaginal infection. They should see their health care provider and be treated if they have problems, such as a mild rash or itching.  During treatment, it is important that you follow these instructions:  Avoid sexual activity or use condoms correctly.  Do not douche.  Avoid alcohol as directed by your health care provider.  Avoid breastfeeding as directed by your health care provider. SEEK MEDICAL CARE IF:   Your symptoms are not improving after 3 days of treatment.  You have increased discharge or pain.  You have a fever. MAKE SURE YOU:   Understand these instructions.  Will watch your condition.  Will get help right away if you are not doing well or get worse. FOR MORE INFORMATION  Centers for Disease Control and Prevention, Division of STD Prevention: [employer] American Sexual Health Association (ASHA): www.ashastd.org    This information is not  intended to replace advice given to you by your health care provider. Make sure you discuss any questions you have with your health care provider.   Document Released: 07/25/2005 Document Revised: 08/15/2014 Document Reviewed: 03/06/2013 Elsevier Interactive Patient Education [employer]

## 2015-09-27 NOTE — ED Provider Notes (Signed)
Patient signed out at the end of shift by Kellogg, PA-C. Patient with complaint of abdominal pain, generalized, for several weeks. EGD on Wednesday (2 days ago). Today with fever, lower abdominal pain, malodorous urine. Today, labs are essentially unremarkable, acute abdominal series is negative. Pain felt unrelated to recent endoscopy.    Plan per previous tx team: Pending pelvic US r/o torsion, PID. If negative, d/c home with treatment for UTI, BV, constipation.  Patient's ultrasound is negative for torsion or concerning acute finding. She can be discharged home with plan of discharge per previous provider.   Elpidio Anis, PA-C 09/27/15 2139  Raeford Razor, MD 10/01/15 [phone]

## 2015-09-28 LAB — GC/CHLAMYDIA PROBE AMP (~~LOC~~) NOT AT ARMC
CHLAMYDIA, DNA PROBE: NEGATIVE
NEISSERIA GONORRHEA: NEGATIVE

## 2015-09-29 LAB — URINE CULTURE

## 2015-10-02 ENCOUNTER — Encounter: Payer: Self-pay | Admitting: Pulmonary Disease

## 2015-10-02 ENCOUNTER — Ambulatory Visit (INDEPENDENT_AMBULATORY_CARE_PROVIDER_SITE_OTHER): Payer: 59 | Admitting: Pulmonary Disease

## 2015-10-02 ENCOUNTER — Encounter: Payer: Self-pay | Admitting: *Deleted

## 2015-10-02 VITALS — BP 110/78 | HR 63 | Ht 66.0 in | Wt 284.0 lb

## 2015-10-02 DIAGNOSIS — G4733 Obstructive sleep apnea (adult) (pediatric): Secondary | ICD-10-CM

## 2015-10-02 DIAGNOSIS — Z9989 Dependence on other enabling machines and devices: Principal | ICD-10-CM

## 2015-10-02 DIAGNOSIS — Z6841 Body Mass Index (BMI) 40.0 and over, adult: Secondary | ICD-10-CM

## 2015-10-02 NOTE — Progress Notes (Signed)
Current Outpatient Prescriptions on File Prior to Visit  Medication Sig  . acetaminophen (TYLENOL) 500 MG tablet Take 1,000 mg by mouth every 6 (six) hours as needed for mild pain.  Marland Kitchen dexlansoprazole (DEXILANT) 60 MG capsule Take 60 mg by mouth daily.  . Homeopathic Products (CHARCOCAPS HOMEOPATHIC PO) Take 1 capsule by mouth every 6 (six) hours as needed (gas).  Marland Kitchen HYDROcodone-acetaminophen (NORCO) 5-325 MG tablet Take 1 tablet by mouth every 6 (six) hours as needed for severe pain.  Marland Kitchen metoCLOPramide (REGLAN) 10 MG tablet Take 10 mg by mouth 3 (three) times daily before meals.  . metroNIDAZOLE (FLAGYL) 500 MG tablet Take 1 tablet (500 mg total) by mouth 2 (two) times daily. One po bid x 7 days  . omeprazole (PRILOSEC) 40 MG capsule Take 40 mg by mouth daily.  . ondansetron (ZOFRAN ODT) 8 MG disintegrating tablet Take 1 tablet (8 mg total) by mouth every 8 (eight) hours as needed for nausea or vomiting.  . polyethylene glycol (MIRALAX / GLYCOLAX) packet Take 17 g by mouth daily.  . sucralfate (CARAFATE) 1 g tablet Take 1 g by mouth 2 (two) times daily.  Marland Kitchen sulfamethoxazole-trimethoprim (BACTRIM DS,SEPTRA DS) 800-160 MG tablet Take 1 tablet by mouth 2 (two) times daily.  . cetirizine (ZYRTEC) 10 MG tablet Take 1 tablet (10 mg total) by mouth daily as needed for allergies or rhinitis. (Patient not taking: Reported on 10/02/2015)   No current facility-administered medications on file prior to visit.    Chief Complaint  Patient presents with  . Follow-up    Not wearing CPAP consisently. Pt advised by PCP that she has an elarged heart possible d/t wearing CPAP.  DME: [facility]     Tests HST 12/24/13 >> AHI 11.5, SaO2 low 85%. Auto CPAP 08/31/15 to 09/29/15 >> used on 3 of 30 nights with average 3 hrs and 40 min.  Average AHI is 4.7 with median CPAP 8 cm H2O and 95 th percentile CPAP 11 cm H20.  Past medical history Depression  PSHx, Medications, Allergies, Fhx, Shx reviewed.  Vital signs BP 110/78  mmHg  Pulse 63  Ht  (1.676 m)  Wt 284 lb (128.822 kg)  BMI 45.86 kg/m2  SpO2 100%  History of Present Illness: Heidi Goodman is a 45 y.o. female smoker with OSA.  She got a cold and sinus congestion several weeks ago.  This has made it difficult for her to use CPAP recently.  She reports compliance with CPAP prior to this otherwise.  She does not otherwise have difficulty with mask fit.  She was told recently that she has an enlarged heart on CXR.    Physical Exam:  General - No distress ENT - No sinus tenderness, no oral exudate, no LAN, MP 4, enlarged tongue Cardiac - s1s2 regular, no murmur Chest - No wheeze/rales/dullness Back - No focal tenderness Abd - Soft, non-tender Ext - No edema Neuro - Normal strength Skin - No rashes Psych - normal mood, and behavior   Assessment/Plan:  Obstructive sleep apnea. Plan: - advised her to use CPAP whenever she is asleep - continue auto CPAP - explained how sleep apnea can impact blood pressure and overnight oxygenation, and how untreated sleep apnea could contribute to development of cardiomegaly   Coralyn Helling, MD  Pulmonary/Critical Care/Sleep Pager:  [phone]

## 2015-10-02 NOTE — Patient Instructions (Signed)
Follow up in 1 year.

## 2015-10-02 NOTE — Addendum Note (Signed)
Addended by: Coralyn Helling on: 10/02/2015 11:33 AM   Modules accepted: Orders

## 2016-01-12 ENCOUNTER — Ambulatory Visit: Payer: Self-pay | Admitting: Pulmonary Disease

## 2016-02-15 ENCOUNTER — Telehealth: Payer: Self-pay | Admitting: Pulmonary Disease

## 2016-02-15 NOTE — Telephone Encounter (Signed)
Called spoke with [facility]. They are needing pt sleep study faxed to [phone]. I have faxed this. Nothing further needed

## 2016-03-29 ENCOUNTER — Telehealth: Payer: Self-pay | Admitting: Gastroenterology

## 2016-03-29 NOTE — Telephone Encounter (Signed)
Patient states that she feels that Dr. Noe GensPeters isn't "fixing" her problems with heartburn and nausea. Patient says that she lives in [place] and our office is closer to her. She is requesting to be seen here. Records placed on Dr. Lanetta InchArmbruster's desk for review.

## 2016-04-01 ENCOUNTER — Encounter: Payer: Self-pay | Admitting: Gastroenterology

## 2016-04-01 NOTE — Telephone Encounter (Signed)
Dr.Armbruster has reviewed previous Gi records and has accepted patient to be scheduled for an Office Visit. Patient has been notified and scheduled.

## 2016-06-14 ENCOUNTER — Ambulatory Visit: Payer: Self-pay | Admitting: Gastroenterology

## 2019-08-09 ENCOUNTER — Ambulatory Visit (HOSPITAL_COMMUNITY)
Admission: EM | Admit: 2019-08-09 | Discharge: 2019-08-09 | Disposition: A | Discharge status: Home / Self Care | Payer: HRSA Program | Attending: Family Medicine | Admitting: Family Medicine

## 2019-08-09 ENCOUNTER — Other Ambulatory Visit: Payer: Self-pay

## 2019-08-09 ENCOUNTER — Encounter (HOSPITAL_COMMUNITY): Payer: Self-pay | Admitting: Emergency Medicine

## 2019-08-09 DIAGNOSIS — Z20822 Contact with and (suspected) exposure to covid-19: Secondary | ICD-10-CM | POA: Diagnosis present

## 2019-08-09 DIAGNOSIS — U071 COVID-19: Secondary | ICD-10-CM | POA: Diagnosis not present

## 2019-08-09 DIAGNOSIS — J111 Influenza due to unidentified influenza virus with other respiratory manifestations: Secondary | ICD-10-CM | POA: Insufficient documentation

## 2019-08-09 NOTE — ED Triage Notes (Signed)
Pt c/o weakness, body aches, headaches. starting Dec 30th. No fever. Pt states her daughter and husband are positive for covid and she was around them christmas.

## 2019-08-09 NOTE — Discharge Instructions (Signed)
This is concerning for covid-19.  Self isolate until covid results are back and negative.  Will notify you by phone of any positive findings. Your negative results will be sent through your MyChart.    Push fluids to ensure adequate hydration and keep secretions thin.   Rest.  Tylenol and/or ibuprofen as needed for pain or fevers.  You may try some vitamins to help your immune system potentially:  Vitamin C 500mg  twice a day. Zing 50mg  daily. Vitamin D 5000IU daily.   If worsening of shortness of breath , difficulty breathing, chest pain , or otherwise worsening please go to the Er.

## 2019-08-09 NOTE — ED Provider Notes (Signed)
[place]    CSN: [id number] Arrival date & time: 08/09/19  0856      History   Chief Complaint Chief Complaint  Patient presents with  . Generalized Body Aches  . Headache    HPI Heidi Goodman is a 49 y.o. female.   Caylan T Eberwein presents with complaints of headache, back pain, body aches. Occasional cough. Fatigue. Symptoms started two days ago. Fatigue worse. No gi symptoms. No nasal drainage or sore throat. Some shortness of breath with activity. No known fevers or chills. Took tylenol, last this morning around 0200. Body aches keep her awake. Daughter and son-in-law tested yesterday positive for covid-19. Was around them last approximately 2 days prior to onset of symptoms. Patient cares for her 53 year old mother, therefore she is concerned about exposing her. History  Of depression, gerd, migraines, obesity.    ROS per HPI, negative if not otherwise mentioned.      Past Medical History:  Diagnosis Date  . Depression 11/12/2013  . OSA (obstructive sleep apnea) 11/20/2013    Patient Active Problem List   Diagnosis Date Noted  . Nasal congestion 04/17/2014  . Obesity 02/13/2014  . GERD (gastroesophageal reflux disease) 02/13/2014  . Tobacco abuse 02/13/2014  . Hx of migraines 12/02/2013  . OSA (obstructive sleep apnea) 11/20/2013  . Depression 11/12/2013    Past Surgical History:  Procedure Laterality Date  . APPENDECTOMY    . FOOT SURGERY      OB History   No obstetric history on file.      Home Medications    Prior to Admission medications   Medication Sig Start Date End Date Taking? Authorizing Provider  acetaminophen (TYLENOL) 500 MG tablet Take 1,000 mg by mouth every 6 (six) hours as needed for mild pain.    [provider]  cetirizine (ZYRTEC) 10 MG tablet Take 1 tablet (10 mg total) by mouth daily as needed for allergies or rhinitis. Patient not taking: Reported on 10/02/2015 04/17/14   Chesley Mires, MD    dexlansoprazole (DEXILANT) 60 MG capsule Take 60 mg by mouth daily.    [provider]  Homeopathic Products (CHARCOCAPS HOMEOPATHIC PO) Take 1 capsule by mouth every 6 (six) hours as needed (gas).    [provider]  HYDROcodone-acetaminophen (NORCO) 5-325 MG tablet Take 1 tablet by mouth every 6 (six) hours as needed for severe pain. 09/27/15   Street, [place], PA-C  metoCLOPramide (REGLAN) 10 MG tablet Take 10 mg by mouth 3 (three) times daily before meals.    [provider]  metroNIDAZOLE (FLAGYL) 500 MG tablet Take 1 tablet (500 mg total) by mouth 2 (two) times daily. One po bid x 7 days 09/27/15   Street, [place], PA-C  omeprazole (PRILOSEC) 40 MG capsule Take 40 mg by mouth daily.    [provider]  ondansetron (ZOFRAN ODT) 8 MG disintegrating tablet Take 1 tablet (8 mg total) by mouth every 8 (eight) hours as needed for nausea or vomiting. 09/27/15   Street, [place], PA-C  polyethylene glycol (MIRALAX / GLYCOLAX) packet Take 17 g by mouth daily. Patient not taking: Reported on 08/09/2019 09/27/15   Street, [place], PA-C  sucralfate (CARAFATE) 1 g tablet Take 1 g by mouth 2 (two) times daily.    [provider]  sulfamethoxazole-trimethoprim (BACTRIM DS,SEPTRA DS) 800-160 MG tablet Take 1 tablet by mouth 2 (two) times daily. 09/27/15   Street, [place], PA-C    Family History Family History  Problem  Relation Age of Onset  . Hypertension Mother   . Hyperlipidemia Mother   . Heart disease Mother 25       CAD, MI  . Hypertension Maternal Grandmother     Social History Social History   Tobacco Use  . Smoking status: Current Every Day Smoker    Packs/day: 0.10    Years: 18.00    Pack years: 1.80    Types: Cigarettes    Last attempt to quit: 02/13/2014    Years since quitting: 5.4  . Smokeless tobacco: Never Used  . Tobacco comment: 1 pack per week QUIT 02/13/14-- Uses VAPOR cig daily  Substance Use Topics  . Alcohol use: No     Alcohol/week: 0.0 standard drinks  . Drug use: No     Allergies   Ivp dye [iodinated diagnostic agents] and Shellfish allergy   Review of Systems Review of Systems   Physical Exam Triage Vital Signs ED Triage Vitals  Enc Vitals Group     BP 08/09/19 0924 129/90     Pulse Rate 08/09/19 0924 70     Resp 08/09/19 0924 20     Temp 08/09/19 0924 98.5 F (36.9 C)     Temp src --      SpO2 08/09/19 0924 100 %     Weight --      Height --      Head Circumference --      Peak Flow --      Pain Score 08/09/19 0926 8     Pain Loc --      Pain Edu? --      Excl. in GC? --    No data found.  Updated Vital Signs BP 129/90   Pulse 70   Temp 98.5 F (36.9 C)   Resp 20   SpO2 100%    Physical Exam Constitutional:      General: She is not in acute distress.    Appearance: She is ill-appearing.  Cardiovascular:     Rate and Rhythm: Normal rate.  Pulmonary:     Effort: Pulmonary effort is normal.  Skin:    General: Skin is warm and dry.  Neurological:     Mental Status: She is alert and oriented to person, place, and time.      UC Treatments / Results  Labs (all labs ordered are listed, but only abnormal results are displayed) Labs Reviewed  NOVEL CORONAVIRUS, NAA (HOSP ORDER, SEND-OUT TO REF LAB; TAT 18-24 HRS)    EKG   Radiology No results found.  Procedures Procedures (including critical care time)  Medications Ordered in UC Medications - No data to display  Initial Impression / Assessment and Plan / UC Course  I have reviewed the triage vital signs and the nursing notes.  Pertinent labs & imaging results that were available during my care of the patient were reviewed by me and considered in my medical decision making (see chart for details).     Non toxic. Afebrile. Stable vitals here today. No increased work of breathing. No shortness of breath. Known covid-19 exposure, high suspicion for covid-19 today. Isolation recommended. Supportive cares  recommended. Return precautions provided. Patient verbalized understanding and agreeable to plan.  Ambulatory out of clinic without difficulty.    Final Clinical Impressions(s) / UC Diagnoses   Final diagnoses:  Influenza-like illness  Suspected COVID-19 virus infection  Close exposure to COVID-19 virus     Discharge Instructions     This is concerning for  covid-19.  Self isolate until covid results are back and negative.  Will notify you by phone of any positive findings. Your negative results will be sent through your MyChart.    Push fluids to ensure adequate hydration and keep secretions thin.   Rest.  Tylenol and/or ibuprofen as needed for pain or fevers.  You may try some vitamins to help your immune system potentially:  Vitamin C 500mg  twice a day. Zing 50mg  daily. Vitamin D 5000IU daily.   If worsening of shortness of breath , difficulty breathing, chest pain , or otherwise worsening please go to the Er.     ED Prescriptions    None     PDMP not reviewed this encounter.   , NP 08/09/19 [phone]

## 2019-08-11 LAB — NOVEL CORONAVIRUS, NAA (HOSP ORDER, SEND-OUT TO REF LAB; TAT 18-24 HRS): SARS-CoV-2, NAA: DETECTED — AB

## 2019-08-12 ENCOUNTER — Other Ambulatory Visit: Payer: Self-pay | Admitting: Critical Care Medicine

## 2019-08-12 ENCOUNTER — Encounter (HOSPITAL_COMMUNITY): Payer: Self-pay

## 2019-08-12 ENCOUNTER — Telehealth: Payer: Self-pay | Admitting: Critical Care Medicine

## 2019-08-12 DIAGNOSIS — U071 COVID-19: Secondary | ICD-10-CM

## 2019-08-12 NOTE — Telephone Encounter (Signed)
I connected with this patient who is Covid positive from January 1 and has been ill since December 30.  The patient has nasal congestion headache severe body aches fatigue cough low-grade fever  She qualifies for monoclonal antibody infusion due to her body mass index being elevated greater than 40  She agrees to the infusion and will be enrolled using the bamlaniviMab  Called to discuss with patient about Covid symptoms and the use of bamlanivimab, a monoclonal antibody infusion for those with mild to moderate Covid symptoms and at a high risk of hospitalization.  Pt is qualified for this infusion at the [facility] infusion center due to [employer]

## 2019-08-12 NOTE — Progress Notes (Signed)
  I connected by phone with Heidi Goodman on 08/12/2019 at 10:54 AM to discuss the potential use of an new treatment for mild to moderate COVID-19 viral infection in non-hospitalized patients.  This patient is a 49 y.o. female that meets the FDA criteria for Emergency Use Authorization of bamlanivimab or casirivimab\imdevimab.  Has a (+) direct SARS-CoV-2 viral test result  Has mild or moderate COVID-19   Is ? 51 years of age and weighs ? 40 kg  Is NOT hospitalized due to COVID-19  Is NOT requiring oxygen therapy or requiring an increase in baseline oxygen flow rate due to COVID-19  Is within 10 days of symptom onset  Has at least one of the high risk factor(s) for progression to severe COVID-19 and/or hospitalization as defined in EUA.  Specific high risk criteria : BMI >/= 35   I have spoken and communicated the following to the patient or parent/caregiver:  1. FDA has authorized the emergency use of bamlanivimab and casirivimab\imdevimab for the treatment of mild to moderate COVID-19 in adults and pediatric patients with positive results of direct SARS-CoV-2 viral testing who are 68 years of age and older weighing at least 40 kg, and who are at high risk for progressing to severe COVID-19 and/or hospitalization.  2. The significant known and potential risks and benefits of bamlanivimab and casirivimab\imdevimab, and the extent to which such potential risks and benefits are unknown.  3. Information on available alternative treatments and the risks and benefits of those alternatives, including clinical trials.  4. Patients treated with bamlanivimab and casirivimab\imdevimab should continue to self-isolate and use infection control measures (e.g., wear mask, isolate, social distance, avoid sharing personal items, clean and disinfect "high touch" surfaces, and frequent handwashing) according to CDC guidelines.   5. The patient or parent/caregiver has the option to accept or refuse  bamlanivimab or casirivimab\imdevimab .  After reviewing this information with the patient, The patient agreed to proceed with receiving the bamlanimivab infusion and will be provided a copy of the Fact sheet prior to receiving the infusion.Heidi Goodman 08/12/2019 10:54 AM

## 2019-08-14 ENCOUNTER — Ambulatory Visit (HOSPITAL_COMMUNITY)
Admission: RE | Admit: 2019-08-14 | Discharge: 2019-08-14 | Disposition: A | Discharge status: Home / Self Care | Payer: BC Managed Care – PPO | Source: Ambulatory Visit | Attending: Pulmonary Disease | Admitting: Pulmonary Disease

## 2019-08-14 DIAGNOSIS — U071 COVID-19: Secondary | ICD-10-CM

## 2019-08-14 DIAGNOSIS — Z6841 Body Mass Index (BMI) 40.0 and over, adult: Secondary | ICD-10-CM | POA: Insufficient documentation

## 2019-08-14 MED ORDER — DIPHENHYDRAMINE HCL 50 MG/ML IJ SOLN: 50.0000 mg | Freq: Once | INTRAMUSCULAR | Status: DC | PRN

## 2019-08-14 MED ORDER — FAMOTIDINE IN NACL 20-0.9 MG/50ML-% IV SOLN: 20.0000 mg | Freq: Once | INTRAVENOUS | Status: DC | PRN

## 2019-08-14 MED ORDER — SODIUM CHLORIDE 0.9 % IV SOLN
700.0000 mg | Freq: Once | INTRAVENOUS | Status: AC
  Administered 2019-08-14: 700 mg via INTRAVENOUS
  Filled 2019-08-14: qty 20

## 2019-08-14 MED ORDER — EPINEPHRINE 0.3 MG/0.3ML IJ SOAJ: 0.3000 mg | Freq: Once | INTRAMUSCULAR | Status: DC | PRN

## 2019-08-14 MED ORDER — SODIUM CHLORIDE 0.9 % IV SOLN
INTRAVENOUS | Status: DC | PRN
  Administered 2019-08-14: 250 mL via INTRAVENOUS

## 2019-08-14 MED ORDER — METHYLPREDNISOLONE SODIUM SUCC 125 MG IJ SOLR: 125.0000 mg | Freq: Once | INTRAMUSCULAR | Status: DC | PRN

## 2019-08-14 MED ORDER — ALBUTEROL SULFATE HFA 108 (90 BASE) MCG/ACT IN AERS: 2.0000 | INHALATION_SPRAY | Freq: Once | RESPIRATORY_TRACT | Status: DC | PRN

## 2019-08-14 NOTE — Progress Notes (Signed)
  Diagnosis: COVID-19  Physician:Dr Wright  Procedure: Covid Infusion Clinic Med: bamlanivimab infusion - Provided patient with bamlanimivab fact sheet for patients, parents and caregivers prior to infusion.  Complications: No immediate complications noted.  Discharge: Discharged home   Marcial Pless W 08/14/2019  

## 2019-08-14 NOTE — Discharge Instructions (Signed)

## 2019-08-15 ENCOUNTER — Ambulatory Visit (HOSPITAL_COMMUNITY): Payer: 59

## 2020-03-10 ENCOUNTER — Ambulatory Visit: Payer: 59 | Attending: Internal Medicine

## 2020-03-10 DIAGNOSIS — Z23 Encounter for immunization: Secondary | ICD-10-CM

## 2020-03-10 NOTE — Progress Notes (Signed)
   Covid-19 Vaccination Clinic  Name:  Heidi Goodman    MRN: [id number] DOB: [birth year]  03/10/2020  Heidi Goodman was observed post Covid-19 immunization for 15 minutes without incident. She was provided with Vaccine Information Sheet and instruction to access the V-Safe system.   Heidi Goodman was instructed to call 911 with any severe reactions post vaccine: Marland Kitchen Difficulty breathing  . Swelling of face and throat  . A fast heartbeat  . A bad rash all over body  . Dizziness and weakness   Immunizations Administered    Name Date Dose VIS Date Route   Moderna COVID-19 Vaccine 03/10/2020  2:40 PM 0.5 mL 07/2019 Intramuscular   Manufacturer: Moderna   Lot: [id number]   NDC: 30092-330-07

## 2020-04-07 ENCOUNTER — Ambulatory Visit: Payer: Self-pay | Attending: Internal Medicine

## 2020-04-07 DIAGNOSIS — Z23 Encounter for immunization: Secondary | ICD-10-CM

## 2020-04-07 NOTE — Progress Notes (Signed)
   Covid-19 Vaccination Clinic  Name:  PRITIKA ALVAREZ    MRN: [id number] DOB: Mar 12, 1971  04/07/2020  Ms. Agcaoili was observed post Covid-19 immunization for 15 minutes without incident. She was provided with Vaccine Information Sheet and instruction to access the V-Safe system.   Ms. Effertz was instructed to call 911 with any severe reactions post vaccine: Marland Kitchen Difficulty breathing  . Swelling of face and throat  . A fast heartbeat  . A bad rash all over body  . Dizziness and weakness   Immunizations Administered    Name Date Dose VIS Date Route   Moderna COVID-19 Vaccine 04/07/2020  9:14 AM 0.5 mL 07/2019 Intramuscular   Manufacturer: Moderna   Lot: [id number]   NDC: 21115-520-80

## 2022-12-22 ENCOUNTER — Ambulatory Visit
Admission: RE | Admit: 2022-12-22 | Discharge: 2022-12-22 | Disposition: A | Discharge status: Home / Self Care | Payer: Self-pay | Source: Ambulatory Visit | Attending: Urgent Care | Admitting: Urgent Care

## 2022-12-22 ENCOUNTER — Ambulatory Visit: Payer: Self-pay

## 2022-12-22 VITALS — BP 136/86 | HR 76 | Temp 98.9°F | Resp 20

## 2022-12-22 DIAGNOSIS — G44209 Tension-type headache, unspecified, not intractable: Secondary | ICD-10-CM

## 2022-12-22 DIAGNOSIS — I10 Essential (primary) hypertension: Secondary | ICD-10-CM

## 2022-12-22 MED ORDER — AMLODIPINE BESYLATE 2.5 MG PO TABS: 2.5000 mg | ORAL_TABLET | Freq: Every day | ORAL | 0 refills | Status: DC

## 2022-12-22 NOTE — ED Triage Notes (Addendum)
Pt c/o HA, elevated BP x 2-3 days-states she has hx of HTN with meds "years ago and they took me off of it"-last dose tylenol 8am-NAD-steady gait

## 2022-12-22 NOTE — Discharge Instructions (Addendum)
Restart amlodipine 2.5mg  for your high blood pressure.   For diabetes or elevated blood sugar, please make sure you are limiting and avoiding starchy, carbohydrate foods like pasta, breads, sweet breads, pastry, rice, potatoes, desserts. These foods can elevate your blood sugar. Also, limit and avoid drinks that contain a lot of sugar such as sodas, sweet teas, fruit juices.  Drinking plain water will be much more helpful, try 64 ounces of water daily.  It is okay to flavor your water naturally by cutting cucumber, lemon, mint or lime, placing it in a picture with water and drinking it over a period of 24-48 hours as long as it remains refrigerated.  For elevated blood pressure, make sure you are monitoring salt in your diet.  Do not eat restaurant foods and limit processed foods at home. I highly recommend you prepare and cook your own foods at home.  Processed foods include things like frozen meals, pre-seasoned meats and dinners, deli meats, canned foods as these foods contain a high amount of sodium/salt.  Make sure you are paying attention to sodium labels on foods you buy at the grocery store. Buy your spices separately such as garlic powder, onion powder, cumin, cayenne, parsley flakes so that you can avoid seasonings that contain salt. However, salt-free seasonings are available and can be used, an example is Mrs. Dash and includes a lot of different mixtures that do not contain salt.  Lastly, when cooking using oils that are healthier for you is important. This includes olive oil, avocado oil, canola oil. We have discussed a lot of foods to avoid but below is a list of foods that can be very healthy to use in your diet whether it is for diabetes, cholesterol, high blood pressure, or in general healthy eating.  Salads - kale, spinach, cabbage, spring mix, arugula Fruits - avocadoes, berries (blueberries, raspberries, blackberries), apples, oranges, pomegranate, grapefruit, kiwi Vegetables -  asparagus, cauliflower, broccoli, green beans, brussel sprouts, bell peppers, beets; stay away from or limit starchy vegetables like potatoes, carrots, peas Other general foods - kidney beans, egg whites, almonds, walnuts, sunflower seeds, pumpkin seeds, fat free yogurt, almond milk, flax seeds, quinoa, oats  Meat - It is better to eat lean meats and limit your red meat including pork to once a week.  Wild caught fish, chicken breast are good options as they tend to be leaner sources of good protein. Still be mindful of the sodium labels for the meats you buy.  DO NOT EAT ANY FOODS ON THIS LIST THAT YOU ARE ALLERGIC TO. For more specific needs, I highly recommend consulting a dietician or nutritionist but this can definitely be a good starting point.

## 2022-12-22 NOTE — ED Provider Notes (Signed)
[facility]  Note:  This document was prepared using Conservation officer, historic buildings and may include unintentional dictation errors.  MRN: [id number] DOB: 12-20-1970  Subjective:   Heidi Goodman is a 52 y.o. female presenting for 2-3 day history of persistent frontal headaches, concerns about her blood pressure.  Has had readings at home in the 140s over 90s.  Has a remote history of taking medication for high blood pressure but cannot recall the name of it.  She has a history of OSA and decided to come off of her CPAP out of concerns of how it would affect her heart.  Patient admits that she recently started making significant dietary changes and hydrating much better, avoiding sodas.  In the past few days she has felt periods of feeling overwhelmed and anxious.  Denies a history of anxiety.  Has a history of depression but has not had to take any medication for her mental health.  Currently does not have a PCP.  No current facility-administered medications for this encounter.  Current Outpatient Medications:    acetaminophen (TYLENOL) 500 MG tablet, Take 1,000 mg by mouth every 6 (six) hours as needed for mild pain., Disp: , Rfl:    cetirizine (ZYRTEC) 10 MG tablet, Take 1 tablet (10 mg total) by mouth daily as needed for allergies or rhinitis. (Patient not taking: Reported on 10/02/2015), Disp: , Rfl:    dexlansoprazole (DEXILANT) 60 MG capsule, Take 60 mg by mouth daily., Disp: , Rfl:    Homeopathic Products (CHARCOCAPS HOMEOPATHIC PO), Take 1 capsule by mouth every 6 (six) hours as needed (gas)., Disp: , Rfl:    HYDROcodone-acetaminophen (NORCO) 5-325 MG tablet, Take 1 tablet by mouth every 6 (six) hours as needed for severe pain., Disp: 6 tablet, Rfl: 0   metoCLOPramide (REGLAN) 10 MG tablet, Take 10 mg by mouth 3 (three) times daily before meals., Disp: , Rfl:    metroNIDAZOLE (FLAGYL) 500 MG tablet, Take 1 tablet (500 mg total) by mouth 2 (two) times daily.  One po bid x 7 days, Disp: 14 tablet, Rfl: 0   omeprazole (PRILOSEC) 40 MG capsule, Take 40 mg by mouth daily., Disp: , Rfl:    ondansetron (ZOFRAN ODT) 8 MG disintegrating tablet, Take 1 tablet (8 mg total) by mouth every 8 (eight) hours as needed for nausea or vomiting., Disp: 10 tablet, Rfl: 0   polyethylene glycol (MIRALAX / GLYCOLAX) packet, Take 17 g by mouth daily. (Patient not taking: Reported on 08/09/2019), Disp: 14 each, Rfl: 0   sucralfate (CARAFATE) 1 g tablet, Take 1 g by mouth 2 (two) times daily., Disp: , Rfl:    sulfamethoxazole-trimethoprim (BACTRIM DS,SEPTRA DS) 800-160 MG tablet, Take 1 tablet by mouth 2 (two) times daily., Disp: 14 tablet, Rfl: 0   Allergies  Allergen Reactions   Ivp Dye [Iodinated Contrast Media] Swelling   Shellfish Allergy Swelling    Past Medical History:  Diagnosis Date   Depression 11/12/2013   OSA (obstructive sleep apnea) 11/20/2013     Past Surgical History:  Procedure Laterality Date   APPENDECTOMY     FOOT SURGERY      Family History  Problem Relation Age of Onset   Hypertension Mother    Hyperlipidemia Mother    Heart disease Mother 50       CAD, MI   Hypertension Maternal Grandmother     Social History   Tobacco Use   Smoking status: Every Day    Packs/day:  0.10    Years: 18.00    Additional pack years: 0.00    Total pack years: 1.80    Types: Cigarettes   Smokeless tobacco: Never   Tobacco comments:    1 pack per week QUIT 02/13/14-- Uses VAPOR cig daily  Vaping Use   Vaping Use: Never used  Substance Use Topics   Alcohol use: Not Currently    Comment: occ   Drug use: No    ROS   Objective:   Vitals: BP 136/86 (BP Location: Right Arm)   Pulse 76   Temp 98.9 F (37.2 C) (Oral)   Resp 20   SpO2 97%   BP Readings from Last 3 Encounters:  12/22/22 136/86  08/14/19 135/87  08/09/19 129/90   Physical Exam Constitutional:      General: She is not in acute distress.    Appearance: Normal appearance. She is  well-developed and normal weight. She is not ill-appearing, toxic-appearing or diaphoretic.  HENT:     Head: Normocephalic and atraumatic.     Right Ear: Tympanic membrane, ear canal and external ear normal. No drainage or tenderness. No middle ear effusion. There is no impacted cerumen. Tympanic membrane is not erythematous or bulging.     Left Ear: Tympanic membrane, ear canal and external ear normal. No drainage or tenderness.  No middle ear effusion. There is no impacted cerumen. Tympanic membrane is not erythematous or bulging.     Nose: Nose normal. No congestion or rhinorrhea.     Mouth/Throat:     Mouth: Mucous membranes are moist. No oral lesions.     Pharynx: No pharyngeal swelling, oropharyngeal exudate, posterior oropharyngeal erythema or uvula swelling.     Tonsils: No tonsillar exudate or tonsillar abscesses.  Eyes:     General: No scleral icterus.       Right eye: No discharge.        Left eye: No discharge.     Extraocular Movements: Extraocular movements intact.     Right eye: Normal extraocular motion.     Left eye: Normal extraocular motion.     Conjunctiva/sclera: Conjunctivae normal.  Neck:     Vascular: No carotid bruit.  Cardiovascular:     Rate and Rhythm: Normal rate and regular rhythm.     Heart sounds: Normal heart sounds. No murmur heard.    No friction rub. No gallop.  Pulmonary:     Effort: Pulmonary effort is normal. No respiratory distress.     Breath sounds: No stridor. No wheezing, rhonchi or rales.  Chest:     Chest wall: No tenderness.  Musculoskeletal:     Cervical back: Normal range of motion and neck supple.  Lymphadenopathy:     Cervical: No cervical adenopathy.  Skin:    General: Skin is warm and dry.  Neurological:     General: No focal deficit present.     Mental Status: She is alert and oriented to person, place, and time.     Cranial Nerves: No cranial nerve deficit.     Motor: No weakness.     Coordination: Coordination normal.      Gait: Gait normal.     Deep Tendon Reflexes: Reflexes normal.     Comments: Negative Romberg and pronator drift.  No facial asymmetry.  Psychiatric:        Mood and Affect: Mood normal.        Behavior: Behavior normal.        Thought Content: Thought content normal.  Judgment: Judgment normal.     Assessment and Plan :   PDMP not reviewed this encounter.  1. Essential hypertension   2. Tension headache    Suspect her headaches are tension type headaches.  She has a normal neurologic exam, clear cardiopulmonary exam.  Will defer ER visit.  Recommend starting amlodipine 2.5 mg once daily for mild hypertension.  Discussed hypertensive friendly diet.  Establish care with a new PCP, provided her with information for this. Counseled patient on potential for adverse effects with medications prescribed/recommended today, ER and return-to-clinic precautions discussed, patient verbalized understanding.    Wallis Bamberg, [place] 12/22/22 [phone]

## 2023-01-11 ENCOUNTER — Emergency Department (HOSPITAL_COMMUNITY): Payer: Self-pay

## 2023-01-11 ENCOUNTER — Other Ambulatory Visit: Payer: Self-pay

## 2023-01-11 ENCOUNTER — Observation Stay (HOSPITAL_COMMUNITY)
Admission: EM | Admit: 2023-01-11 | Discharge: 2023-01-12 | Disposition: A | Discharge status: Home / Self Care | Payer: Self-pay | Attending: Internal Medicine | Admitting: Internal Medicine

## 2023-01-11 DIAGNOSIS — F17210 Nicotine dependence, cigarettes, uncomplicated: Secondary | ICD-10-CM | POA: Insufficient documentation

## 2023-01-11 DIAGNOSIS — G4733 Obstructive sleep apnea (adult) (pediatric): Secondary | ICD-10-CM | POA: Diagnosis present

## 2023-01-11 DIAGNOSIS — Z6841 Body Mass Index (BMI) 40.0 and over, adult: Secondary | ICD-10-CM | POA: Insufficient documentation

## 2023-01-11 DIAGNOSIS — I10 Essential (primary) hypertension: Secondary | ICD-10-CM | POA: Diagnosis present

## 2023-01-11 DIAGNOSIS — G9340 Encephalopathy, unspecified: Principal | ICD-10-CM | POA: Diagnosis present

## 2023-01-11 DIAGNOSIS — Z79899 Other long term (current) drug therapy: Secondary | ICD-10-CM | POA: Insufficient documentation

## 2023-01-11 LAB — URINALYSIS, ROUTINE W REFLEX MICROSCOPIC
Bilirubin Urine: NEGATIVE
Glucose, UA: NEGATIVE mg/dL
Hgb urine dipstick: NEGATIVE
Ketones, ur: NEGATIVE mg/dL
Nitrite: NEGATIVE
Protein, ur: NEGATIVE mg/dL
Specific Gravity, Urine: 1.018 (ref 1.005–1.030)
pH: 6 (ref 5.0–8.0)

## 2023-01-11 LAB — I-STAT CHEM 8, ED
BUN: 13 mg/dL (ref 6–20)
Calcium, Ion: 1.17 mmol/L (ref 1.15–1.40)
Chloride: 104 mmol/L (ref 98–111)
Creatinine, Ser: 0.9 mg/dL (ref 0.44–1.00)
Glucose, Bld: 94 mg/dL (ref 70–99)
HCT: 43 % (ref 36.0–46.0)
Hemoglobin: 14.6 g/dL (ref 12.0–15.0)
Potassium: 3.8 mmol/L (ref 3.5–5.1)
Sodium: 139 mmol/L (ref 135–145)
TCO2: 26 mmol/L (ref 22–32)

## 2023-01-11 LAB — AMMONIA: Ammonia: 28 umol/L (ref 9–35)

## 2023-01-11 LAB — COMPREHENSIVE METABOLIC PANEL
ALT: 21 U/L (ref 0–44)
AST: 20 U/L (ref 15–41)
Albumin: 3.9 g/dL (ref 3.5–5.0)
Alkaline Phosphatase: 87 U/L (ref 38–126)
Anion gap: 13 (ref 5–15)
BUN: 12 mg/dL (ref 6–20)
CO2: 19 mmol/L — ABNORMAL LOW (ref 22–32)
Calcium: 9.3 mg/dL (ref 8.9–10.3)
Chloride: 103 mmol/L (ref 98–111)
Creatinine, Ser: 0.95 mg/dL (ref 0.44–1.00)
GFR, Estimated: >60 mL/min (ref >60)
Glucose, Bld: 94 mg/dL (ref 70–99)
Potassium: 4 mmol/L (ref 3.5–5.1)
Sodium: 135 mmol/L (ref 135–145)
Total Bilirubin: 0.6 mg/dL (ref 0.3–1.2)
Total Protein: 6.7 g/dL (ref 6.5–8.1)

## 2023-01-11 LAB — DIFFERENTIAL
Abs Immature Granulocytes: 0.03 10*3/uL (ref 0.00–0.07)
Basophils Absolute: 0.1 10*3/uL (ref 0.0–0.1)
Basophils Relative: 1 %
Eosinophils Absolute: 0.2 10*3/uL (ref 0.0–0.5)
Eosinophils Relative: 2 %
Immature Granulocytes: 0 %
Lymphocytes Relative: 37 %
Lymphs Abs: 3.5 10*3/uL (ref 0.7–4.0)
Monocytes Absolute: 0.5 10*3/uL (ref 0.1–1.0)
Monocytes Relative: 6 %
Neutro Abs: 5.1 10*3/uL (ref 1.7–7.7)
Neutrophils Relative %: 54 %

## 2023-01-11 LAB — CBC
HCT: 42.6 % (ref 36.0–46.0)
Hemoglobin: 13.6 g/dL (ref 12.0–15.0)
MCH: 30.4 pg (ref 26.0–34.0)
MCHC: 31.9 g/dL (ref 30.0–36.0)
MCV: 95.1 fL (ref 80.0–100.0)
Platelets: 200 10*3/uL (ref 150–400)
RBC: 4.48 MIL/uL (ref 3.87–5.11)
RDW: 12.9 % (ref 11.5–15.5)
WBC: 9.4 10*3/uL (ref 4.0–10.5)
nRBC: 0 % (ref 0.0–0.2)

## 2023-01-11 LAB — PROTIME-INR
INR: 1 (ref 0.8–1.2)
Prothrombin Time: 13.2 seconds (ref 11.4–15.2)

## 2023-01-11 LAB — I-STAT BETA HCG BLOOD, ED (MC, WL, AP ONLY): I-stat hCG, quantitative: <5 m[IU]/mL (ref <5)

## 2023-01-11 LAB — RAPID URINE DRUG SCREEN, HOSP PERFORMED
Amphetamines: NOT DETECTED
Barbiturates: NOT DETECTED
Benzodiazepines: NOT DETECTED
Cocaine: NOT DETECTED
Opiates: NOT DETECTED
Tetrahydrocannabinol: NOT DETECTED

## 2023-01-11 LAB — ETHANOL: Alcohol, Ethyl (B): <10 mg/dL (ref <10)

## 2023-01-11 LAB — APTT: aPTT: 20 seconds — ABNORMAL LOW (ref 24–36)

## 2023-01-11 MED ORDER — DIPHENHYDRAMINE HCL 50 MG/ML IJ SOLN
12.5000 mg | Freq: Once | INTRAMUSCULAR | Status: AC
  Administered 2023-01-11: 12.5 mg via INTRAVENOUS
  Filled 2023-01-11: qty 1

## 2023-01-11 MED ORDER — METOCLOPRAMIDE HCL 5 MG/ML IJ SOLN
10.0000 mg | Freq: Once | INTRAMUSCULAR | Status: AC
  Administered 2023-01-11: 10 mg via INTRAVENOUS
  Filled 2023-01-11: qty 2

## 2023-01-11 MED ORDER — LACTATED RINGERS IV BOLUS
500.0000 mL | Freq: Once | INTRAVENOUS | Status: AC
  Administered 2023-01-11: 500 mL via INTRAVENOUS

## 2023-01-11 MED ORDER — SODIUM CHLORIDE 0.9% FLUSH
3.0000 mL | Freq: Once | INTRAVENOUS | Status: AC
  Administered 2023-01-11: 3 mL via INTRAVENOUS

## 2023-01-11 NOTE — Progress Notes (Signed)
EEG complete - results pending 

## 2023-01-11 NOTE — ED Provider Notes (Signed)
[place] [facility] AT [facility] Provider Note   CSN: [id number] Arrival date & time: 01/11/23  1850  An emergency department physician performed an initial assessment on this suspected stroke patient at 46.  History  Chief Complaint  Patient presents with   Weakness   Altered Mental Status    Aphasia    Heidi Goodman is a 52 y.o. female.  HPI Patient presents for altered mental status.  Medical history includes HTN, OSA, GERD, depression, migraine headaches.  Initial history is provided by EMS.  Patient was reportedly in her normal state of health earlier today.  At around 6 PM, she had acute onset of confusion, aphasia, and dizziness.  Vital signs with EMS were notable for hypertension in the range of 190 SBP.  She has not been speaking.  She has global weakness and ongoing confusion.  History per husband: Patient was in her normal state of health earlier today.  While at rest, she stated that she was feeling dizzy.  She then developed confusion and difficulty with speech.  He suspects that she had 1 beer today.  He denies any drug use by patient today.    Home Medications Prior to Admission medications   Medication Sig Start Date End Date Taking? Authorizing Provider  acetaminophen (TYLENOL) 500 MG tablet Take 1,000 mg by mouth every 6 (six) hours as needed for mild pain.    [provider]  amLODipine (NORVASC) 2.5 MG tablet Take 1 tablet (2.5 mg total) by mouth daily. 12/22/22   Wallis Bamberg, PA-C  cetirizine (ZYRTEC) 10 MG tablet Take 1 tablet (10 mg total) by mouth daily as needed for allergies or rhinitis. Patient not taking: Reported on 10/02/2015 04/17/14   Coralyn Helling, MD  dexlansoprazole (DEXILANT) 60 MG capsule Take 60 mg by mouth daily.    [provider]  Homeopathic Products (CHARCOCAPS HOMEOPATHIC PO) Take 1 capsule by mouth every 6 (six) hours as needed (gas).    [provider]  HYDROcodone-acetaminophen (NORCO)  5-325 MG tablet Take 1 tablet by mouth every 6 (six) hours as needed for severe pain. 09/27/15   Street, [place], PA-C  metoCLOPramide (REGLAN) 10 MG tablet Take 10 mg by mouth 3 (three) times daily before meals.    [provider]  metroNIDAZOLE (FLAGYL) 500 MG tablet Take 1 tablet (500 mg total) by mouth 2 (two) times daily. One po bid x 7 days 09/27/15   Street, [place], PA-C  omeprazole (PRILOSEC) 40 MG capsule Take 40 mg by mouth daily.    [provider]  ondansetron (ZOFRAN ODT) 8 MG disintegrating tablet Take 1 tablet (8 mg total) by mouth every 8 (eight) hours as needed for nausea or vomiting. 09/27/15   Street, [place], PA-C  polyethylene glycol (MIRALAX / GLYCOLAX) packet Take 17 g by mouth daily. Patient not taking: Reported on 08/09/2019 09/27/15   Street, [place], PA-C  sucralfate (CARAFATE) 1 g tablet Take 1 g by mouth 2 (two) times daily.    [provider]  sulfamethoxazole-trimethoprim (BACTRIM DS,SEPTRA DS) 800-160 MG tablet Take 1 tablet by mouth 2 (two) times daily. 09/27/15   Street, [place], PA-C      Allergies    Ivp dye [iodinated contrast media] and Shellfish allergy    Review of Systems   Review of Systems  Unable to perform ROS: Mental status change    Physical Exam Updated Vital Signs BP 126/70   Pulse 70   Temp 98.1 F (36.7 C) (Oral)  Resp 16   Ht 5\' 6"  (1.676 m)   Wt 132.6 kg   SpO2 100%   BMI 47.18 kg/m  Physical Exam Vitals and nursing note reviewed.  Constitutional:      General: She is not in acute distress.    Appearance: She is well-developed. She is not ill-appearing, toxic-appearing or diaphoretic.  HENT:     Head: Normocephalic and atraumatic.     Right Ear: External ear normal.     Left Ear: External ear normal.     Nose: Nose normal.     Mouth/Throat:     Mouth: Mucous membranes are moist.  Eyes:     Extraocular Movements: Extraocular movements intact.     Conjunctiva/sclera: Conjunctivae normal.   Cardiovascular:     Rate and Rhythm: Normal rate and regular rhythm.  Pulmonary:     Effort: Pulmonary effort is normal. No respiratory distress.  Abdominal:     General: There is no distension.     Palpations: Abdomen is soft.  Musculoskeletal:        General: No swelling or deformity.     Cervical back: Normal range of motion and neck supple.     Right lower leg: No edema.     Left lower leg: No edema.  Skin:    General: Skin is warm and dry.     Coloration: Skin is not jaundiced or pale.  Neurological:     Mental Status: She is alert. She is confused.     Cranial Nerves: Dysarthria present. No facial asymmetry.     Comments: Neuroexam limited by patient's seeming inability to follow/understand commands.     ED Results / Procedures / Treatments   Labs (all labs ordered are listed, but only abnormal results are displayed) Labs Reviewed  APTT - Abnormal; Notable for the following components:      Result Value   aPTT 20 (*)    All other components within normal limits  COMPREHENSIVE METABOLIC PANEL - Abnormal; Notable for the following components:   CO2 19 (*)    All other components within normal limits  URINALYSIS, ROUTINE W REFLEX MICROSCOPIC - Abnormal; Notable for the following components:   APPearance HAZY (*)    Leukocytes,Ua TRACE (*)    Bacteria, UA RARE (*)    All other components within normal limits  PROTIME-INR  CBC  DIFFERENTIAL  ETHANOL  AMMONIA  RAPID URINE DRUG SCREEN, HOSP PERFORMED  HIV ANTIBODY (ROUTINE TESTING W REFLEX)  BASIC METABOLIC PANEL  CBC  I-STAT CHEM 8, ED  CBG MONITORING, ED  I-STAT BETA HCG BLOOD, ED ([facility], [facility], [facility] ONLY)    EKG None  Radiology EEG adult  Result Date: 01/12/2023 Rejeana Brock, MD     01/12/2023  1:13 AM History: 52 yo F with AMS Sedation: None Technique: This EEG was acquired with electrodes placed according to the International 10-20 electrode system (including Fp1, Fp2, F3, F4, C3, C4, P3, P4, O1, O2, T3,  T4, T5, T6, A1, A2, Fz, Cz, Pz). The following electrodes were missing or displaced: none. Background: The background consists of intermixed alpha and beta activities. There is a well defined posterior dominant rhythm of 8-9 Hz that attenuates with eye opening. Sleep is recorded with normal appearing structures. Photic stimulation: Physiologic driving is not performed EEG Abnormalities: none Clinical Interpretation: This normal EEG is recorded in the waking and sleep state. There was no seizure or seizure predisposition recorded on this study. Please note that lack of epileptiform  activity on EEG does not preclude the possibility of epilepsy. Ritta Slot, MD [facility] [phone] If 7pm- 7am, please page neurology on call as listed in AMION.    Procedures Procedures    Medications Ordered in ED Medications  amLODipine (NORVASC) tablet 2.5 mg (has no administration in time range)  enoxaparin (LOVENOX) injection 60 mg (has no administration in time range)  sodium chloride flush (NS) 0.9 % injection 3 mL (has no administration in time range)  acetaminophen (TYLENOL) tablet 650 mg (has no administration in time range)    Or  acetaminophen (TYLENOL) suppository 650 mg (has no administration in time range)  senna-docusate (Senokot-S) tablet 1 tablet (has no administration in time range)  ondansetron (ZOFRAN) tablet 4 mg (has no administration in time range)    Or  ondansetron (ZOFRAN) injection 4 mg (has no administration in time range)  sodium chloride flush (NS) 0.9 % injection 3 mL (3 mLs Intravenous Given 01/11/23 2142)  metoCLOPramide (REGLAN) injection 10 mg (10 mg Intravenous Given 01/11/23 2138)  diphenhydrAMINE (BENADRYL) injection 12.5 mg (12.5 mg Intravenous Given 01/11/23 2138)  lactated ringers bolus 500 mL (0 mLs Intravenous Stopped 01/11/23 2321)    ED Course/ Medical Decision Making/ A&P                             Medical Decision Making Amount and/or Complexity  of Data Reviewed Labs: ordered. Radiology: ordered.  Risk Prescription drug management. Decision regarding hospitalization.   This patient presents to the ED for concern of altered mental status, this involves an extensive number of treatment options, and is a complaint that carries with it a high risk of complications and morbidity.  The differential diagnosis includes CVA, TIA, seizures, hypertensive encephalopathy, other encephalopathy, complex migraine, intoxication   Co morbidities that complicate the patient evaluation  HTN, OSA, GERD, depression, migraine headaches   Additional history obtained:  Additional history obtained from EMS, patient's husband External records from outside source obtained and reviewed including EMR   Lab Tests:  I Ordered, and personally interpreted labs.  The pertinent results include: Normal hemoglobin, no leukocytosis, normal kidney function, normal electrolytes, no evidence of UTI, UDS negative   Imaging Studies ordered:  I ordered imaging studies including CT head, MRI brain I independently visualized and interpreted imaging which showed no acute findings I agree with the radiologist interpretation   Cardiac Monitoring: / EKG:  The patient was maintained on a cardiac monitor.  I personally viewed and interpreted the cardiac monitored which showed an underlying rhythm of: Sinus rhythm   Consultations Obtained:  I requested consultation with the neurologist, Dr. Amada Jupiter,  and discussed lab and imaging findings as well as pertinent plan - they recommend: MR studies, EEG, neurology will continue to follow   Problem List / ED Course / Critical interventions / Medication management  Patient presents for acute onset of altered mental status.  This occurred at approximately 6 PM this evening.  She was in her normal state of health prior to that.  Patient had complaints of dizziness at time of onset.  She has had very limited speech.  EMS  noted global weakness.  Patient arrives as a code stroke.  No unilateral deficits are appreciated.  Patient seems confused and has limited ability to follow commands for neuroexam.  She does appear to have ongoing difficulty with speech.  Vital signs on arrival are notable for hypertension.  Patient was taken directly to  CT scanner.  Noncontrasted CTs does not show any acute findings.  While in the ED, patient had improvement in blood pressure.  She did have some improvement in speech.  MRI studies were negative.  EEG, ordered by neurology, was initiated while in the ED.  Patient was admitted to medicine for further management.  Social Determinants of Health:  Lives at home with family         Final Clinical Impression(s) / ED Diagnoses Final diagnoses:  Acute encephalopathy    Rx / DC Orders ED Discharge Orders     None         Gloris Manchester, MD 01/12/23 [phone]

## 2023-01-11 NOTE — Consult Note (Signed)
Neurology Consultation Reason for Consult: Altered mental status Referring Physician: Durwin Nora, R  CC: Altered mental status  History is obtained from: Husband  HPI: Heidi Goodman is a 52 y.o. female who was in her normal state of health earlier today when she had abrupt onset of dizziness followed by altered mental status.  She was sitting on the porch when she stood up and got "dizzy."  Falling to the background, she was altered, not answering questions appropriately and therefore 911 was called.  On arrival EMS found her to be very confused, not answering questions and therefore code stroke was activated.  She was brought in emergently taken for CT.  She had a nonfocal exam, though she was not speaking well and acting quite confused.  Given the lack of focal findings, she was taken emergently for MRI prior to consideration of thrombolytics which was negative for any type of acute ischemia.   LKW: 1805 tnk given?: no, nto a stroke  Past Medical History:  Diagnosis Date   Depression 11/12/2013   OSA (obstructive sleep apnea) 11/20/2013     Family History  Problem Relation Age of Onset   Hypertension Mother    Hyperlipidemia Mother    Heart disease Mother 37       CAD, MI   Hypertension Maternal Grandmother      Social History:  reports that she has been smoking cigarettes. She has a 1.80 pack-year smoking history. She has never used smokeless tobacco. She reports that she does not currently use alcohol. She reports that she does not use drugs.   Exam: Current vital signs: Ht 5\' 6"  (1.676 m)   Wt 132.6 kg   SpO2 96%   BMI 47.18 kg/m  Vital signs in last 24 hours: SpO2:  [96 %] 96 % (06/05 1921) Weight:  [132.6 kg] 132.6 kg (06/05 1800)   Physical Exam  Appears well-developed and well-nourished.   Neuro: Mental Status: Patient is awake, very clearly anxious and confused.  She asked for her husband, without dysarthria, but does not follow commands or answer  questions. Cranial Nerves: II: She blinks to threat bilaterally. Pupils are equal, round, and reactive to light.   III,IV, VI: There was some question of a mild right gaze preference, but she does cross midline readily and fully and tracking. V: She responds to mild stimulation on her face bilaterally VII: Facial movement is symmetric.  VIII: hearing is intact to voice Motor: She does not have any clear focal weakness, but does not cooperate with holding any of her extremities aloft, she does withdraw to noxious stimulation in all four. Sensory: As above  Cerebellar: Does not perform   I have reviewed labs in epic and the results pertinent to this consultation are: Chem-8 is unremarkable   I have reviewed the images obtained: CT-negative, MRI/MRA-no evidence of ischemia or intracranial LVO  Impression: 52 year old female with acute altered mental status after standing in the setting of severe hypertension.  Possibilities include hypertensive encephalopathy, seizure, anxiety, less likely to be acute ischemia with negative imaging.  Recommendations: 1) EEG 2) CMP, CBC, UA, UDS, ammonia 3) neurology will continue to follow   Ritta Slot, MD [facility] [phone]  If 7pm- 7am, please page neurology on call as listed in AMION.

## 2023-01-11 NOTE — ED Notes (Signed)
Pt's husband [name]  [phone]

## 2023-01-11 NOTE — Code Documentation (Signed)
MRI negative for stroke. Code Stroke cancelled at 2026 per Dr. Amada Jupiter.

## 2023-01-11 NOTE — ED Triage Notes (Signed)
Per EMS report, pt was at home with friends and family when she stood up and had a sudden onset of dizziness, confusion, and aphasia. Upon arrival to ED, pt is not following commands and has overall weakness. Pt favoring the right side with her eyes, but will look to her left upon command. Has a hx of HTN and was put on a new BP med a week ago. The husband thinks it was lisinopril. Pt had an episode similar to tonight's episode about 3 weeks ago, but it was for a short time only and urgent care said it was her elevated BP.

## 2023-01-11 NOTE — ED Notes (Signed)
Pt brought back from MRI by this RN and taken to ED 2.  Pt connected to monitor for vitals and provider at bedside at this time.  On way back from MRI, pt able to ask where she was and why she was in the ED.  Upon this RN's initial assessment in MRI, pt only able to respond to name but did not verbalize much else.

## 2023-01-11 NOTE — Code Documentation (Signed)
Heidi Goodman is a 52 yr old female with a PMH HTN and contrast allergy arriving to [facility] via EMS on 01/11/2023. Pt is from home where she had a witnessed onset of dizziness, weakness and aphasia at 1805. Pt is not on any known thinners.    Pt met at bridge by stroke team. CBG obtained, airway cleared by EDP. Pt to CT with team. [id number]. (Please see documentation for details of NIHSS and timeline). Pt with confusion, aphasia and weakness. She appears scared. The following imaging was obtained: CT. CT neg for hemorrhage per Dr. Amada Jupiter. Pt to MRI for Stat MRI, MRI (as unable to obtain CTA due to contrast allergy. Labs obtained in MRI. MRI results pending. Handoff with EDRN and RRN at 2000.

## 2023-01-12 ENCOUNTER — Encounter (HOSPITAL_COMMUNITY): Payer: Self-pay | Admitting: Family Medicine

## 2023-01-12 DIAGNOSIS — R4182 Altered mental status, unspecified: Secondary | ICD-10-CM

## 2023-01-12 DIAGNOSIS — G9340 Encephalopathy, unspecified: Secondary | ICD-10-CM | POA: Diagnosis present

## 2023-01-12 DIAGNOSIS — I10 Essential (primary) hypertension: Secondary | ICD-10-CM

## 2023-01-12 HISTORY — DX: Essential (primary) hypertension: I10

## 2023-01-12 LAB — CBC
HCT: 38.7 % (ref 36.0–46.0)
Hemoglobin: 12.4 g/dL (ref 12.0–15.0)
MCH: 30.2 pg (ref 26.0–34.0)
MCHC: 32 g/dL (ref 30.0–36.0)
MCV: 94.2 fL (ref 80.0–100.0)
Platelets: 267 10*3/uL (ref 150–400)
RBC: 4.11 MIL/uL (ref 3.87–5.11)
RDW: 13 % (ref 11.5–15.5)
WBC: 7.7 10*3/uL (ref 4.0–10.5)
nRBC: 0 % (ref 0.0–0.2)

## 2023-01-12 LAB — HIV ANTIBODY (ROUTINE TESTING W REFLEX): HIV Screen 4th Generation wRfx: NONREACTIVE

## 2023-01-12 LAB — BASIC METABOLIC PANEL
Anion gap: 9 (ref 5–15)
BUN: 12 mg/dL (ref 6–20)
CO2: 22 mmol/L (ref 22–32)
Calcium: 9.1 mg/dL (ref 8.9–10.3)
Chloride: 105 mmol/L (ref 98–111)
Creatinine, Ser: 0.73 mg/dL (ref 0.44–1.00)
GFR, Estimated: >60 mL/min (ref >60)
Glucose, Bld: 92 mg/dL (ref 70–99)
Potassium: 3.4 mmol/L — ABNORMAL LOW (ref 3.5–5.1)
Sodium: 136 mmol/L (ref 135–145)

## 2023-01-12 LAB — GLUCOSE, CAPILLARY: Glucose-Capillary: 87 mg/dL (ref 70–99)

## 2023-01-12 MED ORDER — ENOXAPARIN SODIUM 60 MG/0.6ML IJ SOSY
60.0000 mg | PREFILLED_SYRINGE | INTRAMUSCULAR | Status: DC
  Administered 2023-01-12: 60 mg via SUBCUTANEOUS
  Filled 2023-01-12: qty 0.6

## 2023-01-12 MED ORDER — ONDANSETRON HCL 4 MG PO TABS: 4.0000 mg | ORAL_TABLET | Freq: Four times a day (QID) | ORAL | Status: DC | PRN

## 2023-01-12 MED ORDER — AMLODIPINE BESYLATE 5 MG PO TABS
2.5000 mg | ORAL_TABLET | Freq: Every day | ORAL | Status: DC
  Filled 2023-01-12: qty 1

## 2023-01-12 MED ORDER — SODIUM CHLORIDE 0.9% FLUSH: 3.0000 mL | Freq: Two times a day (BID) | INTRAVENOUS | Status: DC

## 2023-01-12 MED ORDER — ACETAMINOPHEN 650 MG RE SUPP: 650.0000 mg | Freq: Four times a day (QID) | RECTAL | Status: DC | PRN

## 2023-01-12 MED ORDER — ALUM & MAG HYDROXIDE-SIMETH 200-200-20 MG/5ML PO SUSP
15.0000 mL | ORAL | Status: DC | PRN
  Administered 2023-01-12: 15 mL via ORAL
  Filled 2023-01-12: qty 30

## 2023-01-12 MED ORDER — ONDANSETRON HCL 4 MG/2ML IJ SOLN: 4.0000 mg | Freq: Four times a day (QID) | INTRAMUSCULAR | Status: DC | PRN

## 2023-01-12 MED ORDER — AMLODIPINE BESYLATE 5 MG PO TABS: 5.0000 mg | ORAL_TABLET | Freq: Every day | ORAL | 1 refills | Status: AC

## 2023-01-12 MED ORDER — SENNOSIDES-DOCUSATE SODIUM 8.6-50 MG PO TABS: 1.0000 | ORAL_TABLET | Freq: Every evening | ORAL | Status: DC | PRN

## 2023-01-12 MED ORDER — ACETAMINOPHEN 325 MG PO TABS
650.0000 mg | ORAL_TABLET | Freq: Four times a day (QID) | ORAL | Status: DC | PRN
  Administered 2023-01-12 (×2): 650 mg via ORAL
  Filled 2023-01-12 (×2): qty 2

## 2023-01-12 NOTE — ED Notes (Signed)
Pt transferred to inpatient unit via stretcher with all belongings. 

## 2023-01-12 NOTE — Discharge Summary (Signed)
PATIENT DETAILS Name: Heidi Goodman Age: 52 y.o. Sex: female Date of Birth: Jun 15, 1971 MRN: [id number]. Admitting Physician: Briscoe Deutscher, MD [id number], [facility]  Admit Date: 01/11/2023 Discharge date: 01/12/2023  Recommendations for Outpatient Follow-up:  Follow up with PCP in 1-2 weeks Please obtain CMP/CBC in one week  Admitted From:  Home  Disposition: Home   Discharge Condition: good  CODE STATUS:   Code Status: Full Code   Diet recommendation:  Diet Order             Diet regular Room service appropriate? Yes; Fluid consistency: Thin  Diet effective now           Diet - low sodium heart healthy                    Brief Summary: 52 year old with history of HTN, depression, OSA on CPAP who came in for evaluation of transient dizziness/confusion and difficulty speaking.  She underwent extensive neuroimaging-an EEG which did not reveal any acute abnormalities.  See below for further details.  Brief Hospital Course: Acute encephalopathy Unclear etiology-no significant abnormality detected on neuroimaging.  EEG stable. Since her blood pressure was elevated in the ED-this could be hypertensive encephalopathy. She is back to her baseline-discussed with neurology team this morning-no further workup planned-okay for discharge.  Discussed with patient-if her symptoms recur-she will need more workup.  HTN  blood pressure has stabilized-on her usual regimen of amlodipine-since it was significantly elevated yesterday-will double home dose.  Rest of the medical problems were stable during the short overnight hospitalization.  Morbid Obesity: Estimated body mass index is 47.18 kg/m as calculated from the following:   Height as of this encounter: 5\' 6"  (1.676 m).   Weight as of this encounter: 132.6 kg.    Discharge Diagnoses:  Principal Problem:   Acute encephalopathy Active Problems:   OSA (obstructive sleep apnea)   Hypertension   Discharge  Instructions:  Activity:  As tolerated   Discharge Instructions     Diet - low sodium heart healthy   Complete by: As directed    Discharge instructions   Complete by: As directed    Follow with Primary MD  Center, [facility] in 1-2 weeks  Please get a complete blood count and chemistry panel checked by your Primary MD at your next visit, and again as instructed by your Primary MD.  Get Medicines reviewed and adjusted: Please take all your medications with you for your next visit with your Primary MD  Laboratory/radiological data: Please request your Primary MD to go over all hospital tests and procedure/radiological results at the follow up, please ask your Primary MD to get all Hospital records sent to his/her office.  In some cases, they will be blood work, cultures and biopsy results pending at the time of your discharge. Please request that your primary care M.D. follows up on these results.  Also Note the following: If you experience worsening of your admission symptoms, develop shortness of breath, life threatening emergency, suicidal or homicidal thoughts you must seek medical attention immediately by calling 911 or calling your MD immediately  if symptoms less severe.  You must read complete instructions/literature along with all the possible adverse reactions/side effects for all the Medicines you take and that have been prescribed to you. Take any new Medicines after you have completely understood and accpet all the possible adverse reactions/side effects.   Do not drive when taking Pain medications or sleeping medications (Benzodaizepines)  Do not take more than prescribed Pain, Sleep and Anxiety Medications. It is not advisable to combine anxiety,sleep and pain medications without talking with your primary care practitioner  Special Instructions: If you have smoked or chewed Tobacco  in the last 2 yrs please stop smoking, stop any regular Alcohol  and or any  Recreational drug use.  Wear Seat belts while driving.  Please note: You were cared for by a hospitalist during your hospital stay. Once you are discharged, your primary care physician will handle any further medical issues. Please note that NO REFILLS for any discharge medications will be authorized once you are discharged, as it is imperative that you return to your primary care physician (or establish a relationship with a primary care physician if you do not have one) for your post hospital discharge needs so that they can reassess your need for medications and monitor your lab values.   Increase activity slowly   Complete by: As directed       Allergies as of 01/31/23       Reactions   Ivp Dye [iodinated Contrast Media] Swelling   Shellfish Allergy Swelling        Medication List     STOP taking these medications    acetaminophen 500 MG tablet Commonly known as: TYLENOL   cetirizine 10 MG tablet Commonly known as: ZYRTEC   CHARCOCAPS HOMEOPATHIC PO   dexlansoprazole 60 MG capsule Commonly known as: DEXILANT   HYDROcodone-acetaminophen 5-325 MG tablet Commonly known as: Norco   metoCLOPramide 10 MG tablet Commonly known as: REGLAN   metroNIDAZOLE 500 MG tablet Commonly known as: Flagyl   omeprazole 40 MG capsule Commonly known as: PRILOSEC   ondansetron 8 MG disintegrating tablet Commonly known as: Zofran ODT   polyethylene glycol 17 g packet Commonly known as: MIRALAX / GLYCOLAX   sucralfate 1 g tablet Commonly known as: CARAFATE   sulfamethoxazole-trimethoprim 800-160 MG tablet Commonly known as: BACTRIM DS       TAKE these medications    amLODipine 5 MG tablet Commonly known as: NORVASC Take 1 tablet (5 mg total) by mouth daily. What changed:  medication strength how much to take        Follow-up Information     Center, [facility]. Schedule an appointment as soon as possible for a visit in 1 week(s).   Contact  information: [address] [place] [address] [phone]                Allergies  Allergen Reactions   Ivp Dye [Iodinated Contrast Media] Swelling   Shellfish Allergy Swelling     Other Procedures/Studies: EEG adult  Result Date: January 31, 2023 Rejeana Brock, MD     Jan 31, 2023  1:13 AM History: 52 yo F with AMS Sedation: None Technique: This EEG was acquired with electrodes placed according to the International 10-20 electrode system (including Fp1, Fp2, F3, F4, C3, C4, P3, P4, O1, O2, T3, T4, T5, T6, A1, A2, Fz, Cz, Pz). The following electrodes were missing or displaced: none. Background: The background consists of intermixed alpha and beta activities. There is a well defined posterior dominant rhythm of 8-9 Hz that attenuates with eye opening. Sleep is recorded with normal appearing structures. Photic stimulation: Physiologic driving is not performed EEG Abnormalities: none Clinical Interpretation: This normal EEG is recorded in the waking and sleep state. There was no seizure or seizure predisposition recorded on this study. Please note that lack of epileptiform activity on EEG does  not preclude the possibility of epilepsy. Ritta Slot, MD [facility] [phone] If 7pm- 7am, please page neurology on call as listed in AMION.   MR ANGIO NECK WO CONTRAST  Result Date: 01/11/2023 CLINICAL DATA:  Acute neurologic deficit EXAM: MRA NECK WITHOUT CONTRAST TECHNIQUE: Angiographic images of the neck were acquired using MRA technique without intravenous contrast. Carotid stenosis measurements (when applicable) are obtained utilizing NASCET criteria, using the distal internal carotid diameter as the denominator. COMPARISON:  None Available. FINDINGS: Aortic arch: Normal branching pattern. Right carotid system: Normal Left carotid system: Normal Vertebral arteries: Codominant.  Normal. Other: None. IMPRESSION: Normal MRA of the neck. Electronically Signed   By:  Deatra Robinson M.D.   On: 01/11/2023 21:31   MR BRAIN WO CONTRAST  Result Date: 01/11/2023 CLINICAL DATA:  Neuro deficit, acute, stroke suspected EXAM: MRI HEAD WITHOUT  CONTRAST MRA HEAD WITHOUT CONTRAST TECHNIQUE: Multiplanar, multi-echo pulse sequences of the brain and surrounding structures were acquired without intravenous contrast. Angiographic images of the Circle of Willis were acquired using MRA technique without intravenous contrast. COMPARISON:  Same day CT, MR Head 10/23/04. FINDINGS: MRI HEAD FINDINGS Brain: Negative for an acute infarct. No hemorrhage. No extra-axial fluid collection. No hydrocephalus. Few small scattered subcortical and periventricular T2/FLAIR hyperintense lesions are nonspecific, but progressed compared to 2006. Vascular: Normal flow voids.  See below Skull and upper cervical spine: Normal marrow signal. Sinuses/Orbits: No middle ear or mastoid effusion. Paranasal sinuses are clear. Orbits are unremarkable. Other: None. MRA HEAD FINDINGS Anterior circulation: No aneurysm. No stenosis. No proximal occlusion. Posterior circulation: No aneurysm. No stenosis. No proximal occlusion. Anatomic variants: Fetal PCA on the right. IMPRESSION: 1. Negative for an acute infarct. 2. No intracranial large vessel occlusion or significant stenosis. 3. Few small scattered subcortical and periventricular T2/FLAIR hyperintense lesions are nonspecific, but progressed compared to 2006. Differential considerations include chronic microvascular ischemic changes, demyelinating disease, or sequela of migraines. Electronically Signed   By: Lorenza Cambridge M.D.   On: 01/11/2023 20:07   MR ANGIO HEAD WO CONTRAST  Result Date: 01/11/2023 CLINICAL DATA:  Neuro deficit, acute, stroke suspected EXAM: MRI HEAD WITHOUT  CONTRAST MRA HEAD WITHOUT CONTRAST TECHNIQUE: Multiplanar, multi-echo pulse sequences of the brain and surrounding structures were acquired without intravenous contrast. Angiographic images of the  Circle of Willis were acquired using MRA technique without intravenous contrast. COMPARISON:  Same day CT, MR Head 10/23/04. FINDINGS: MRI HEAD FINDINGS Brain: Negative for an acute infarct. No hemorrhage. No extra-axial fluid collection. No hydrocephalus. Few small scattered subcortical and periventricular T2/FLAIR hyperintense lesions are nonspecific, but progressed compared to 2006. Vascular: Normal flow voids.  See below Skull and upper cervical spine: Normal marrow signal. Sinuses/Orbits: No middle ear or mastoid effusion. Paranasal sinuses are clear. Orbits are unremarkable. Other: None. MRA HEAD FINDINGS Anterior circulation: No aneurysm. No stenosis. No proximal occlusion. Posterior circulation: No aneurysm. No stenosis. No proximal occlusion. Anatomic variants: Fetal PCA on the right. IMPRESSION: 1. Negative for an acute infarct. 2. No intracranial large vessel occlusion or significant stenosis. 3. Few small scattered subcortical and periventricular T2/FLAIR hyperintense lesions are nonspecific, but progressed compared to 2006. Differential considerations include chronic microvascular ischemic changes, demyelinating disease, or sequela of migraines. Electronically Signed   By: Lorenza Cambridge M.D.   On: 01/11/2023 20:07   CT HEAD CODE STROKE WO CONTRAST  Result Date: 01/11/2023 CLINICAL DATA:  Code stroke. Neuro deficit, acute, stroke suspected. No lateralizing sinus available at the time of interpretation. EXAM: CT  HEAD WITHOUT CONTRAST TECHNIQUE: Contiguous axial images were obtained from the base of the skull through the vertex without intravenous contrast. RADIATION DOSE REDUCTION: This exam was performed according to the departmental dose-optimization program which includes automated exposure control, adjustment of the mA and/or kV according to patient size and/or use of iterative reconstruction technique. COMPARISON:  CT Head 02/06/04 FINDINGS: Brain: Possible cerebral cortical based hypodensity in the  right frontal lobe (series 3, image 20, 21). Early mineralization of the basal ganglia left. No evidence of hemorrhage, hydrocephalus, extra-axial collection or mass lesion/mass effect. Vascular: No hyperdense vessel or unexpected calcification. Skull: Normal. Negative for fracture or focal lesion. Sinuses/Orbits: No acute finding. Other: None. ASPECTS [facility] Stroke Program Early CT Score): 10 IMPRESSION: No hemorrhage or CT evidence of an acute cortical infarct. Possible subtle cortical based hypodensity in the right frontal lobe versus artifact. Findings were discussed with Dr. Amada Jupiter on 01/11/2023 at 7:14 p.m Electronically Signed   By: Lorenza Cambridge M.D.   On: 01/11/2023 19:26     TODAY-DAY OF DISCHARGE:  Subjective:   Heidi Goodman today has no headache,no chest abdominal pain,no new weakness tingling or numbness, feels much better wants to go home today.   Objective:   Blood pressure 106/72, pulse 61, temperature 98.2 F (36.8 C), temperature source Oral, resp. rate 17, height 5\' 6"  (1.676 m), weight 132.6 kg, SpO2 100 %.  Intake/Output Summary (Last 24 hours) at 01-29-23 0833 Last data filed at 01/11/2023 2321 Gross per 24 hour  Intake 500 ml  Output --  Net 500 ml   Filed Weights   01/11/23 1800  Weight: 132.6 kg    Exam: Awake Alert, Oriented *3, No new F.N deficits, Normal affect [place].AT,PERRAL Supple Neck,No JVD, No cervical lymphadenopathy appriciated.  Symmetrical Chest wall movement, Good air movement bilaterally, CTAB RRR,No Gallops,Rubs or new Murmurs, No Parasternal Heave +ve B.Sounds, Abd Soft, Non tender, No organomegaly appriciated, No rebound -guarding or rigidity. No Cyanosis, Clubbing or edema, No new Rash or bruise   PERTINENT RADIOLOGIC STUDIES: EEG adult  Result Date: 01/29/2023 Rejeana Brock, MD     January 29, 2023  1:13 AM History: 52 yo F with AMS Sedation: None Technique: This EEG was acquired with electrodes placed according to the  International 10-20 electrode system (including Fp1, Fp2, F3, F4, C3, C4, P3, P4, O1, O2, T3, T4, T5, T6, A1, A2, Fz, Cz, Pz). The following electrodes were missing or displaced: none. Background: The background consists of intermixed alpha and beta activities. There is a well defined posterior dominant rhythm of 8-9 Hz that attenuates with eye opening. Sleep is recorded with normal appearing structures. Photic stimulation: Physiologic driving is not performed EEG Abnormalities: none Clinical Interpretation: This normal EEG is recorded in the waking and sleep state. There was no seizure or seizure predisposition recorded on this study. Please note that lack of epileptiform activity on EEG does not preclude the possibility of epilepsy. Ritta Slot, MD [facility] [phone] If 7pm- 7am, please page neurology on call as listed in AMION.   MR ANGIO NECK WO CONTRAST  Result Date: 01/11/2023 CLINICAL DATA:  Acute neurologic deficit EXAM: MRA NECK WITHOUT CONTRAST TECHNIQUE: Angiographic images of the neck were acquired using MRA technique without intravenous contrast. Carotid stenosis measurements (when applicable) are obtained utilizing NASCET criteria, using the distal internal carotid diameter as the denominator. COMPARISON:  None Available. FINDINGS: Aortic arch: Normal branching pattern. Right carotid system: Normal Left carotid system: Normal Vertebral arteries: Codominant.  Normal. Other: None.  IMPRESSION: Normal MRA of the neck. Electronically Signed   By: Deatra Robinson M.D.   On: 01/11/2023 21:31   MR BRAIN WO CONTRAST  Result Date: 01/11/2023 CLINICAL DATA:  Neuro deficit, acute, stroke suspected EXAM: MRI HEAD WITHOUT  CONTRAST MRA HEAD WITHOUT CONTRAST TECHNIQUE: Multiplanar, multi-echo pulse sequences of the brain and surrounding structures were acquired without intravenous contrast. Angiographic images of the Circle of Willis were acquired using MRA technique without intravenous  contrast. COMPARISON:  Same day CT, MR Head 10/23/04. FINDINGS: MRI HEAD FINDINGS Brain: Negative for an acute infarct. No hemorrhage. No extra-axial fluid collection. No hydrocephalus. Few small scattered subcortical and periventricular T2/FLAIR hyperintense lesions are nonspecific, but progressed compared to 2006. Vascular: Normal flow voids.  See below Skull and upper cervical spine: Normal marrow signal. Sinuses/Orbits: No middle ear or mastoid effusion. Paranasal sinuses are clear. Orbits are unremarkable. Other: None. MRA HEAD FINDINGS Anterior circulation: No aneurysm. No stenosis. No proximal occlusion. Posterior circulation: No aneurysm. No stenosis. No proximal occlusion. Anatomic variants: Fetal PCA on the right. IMPRESSION: 1. Negative for an acute infarct. 2. No intracranial large vessel occlusion or significant stenosis. 3. Few small scattered subcortical and periventricular T2/FLAIR hyperintense lesions are nonspecific, but progressed compared to 2006. Differential considerations include chronic microvascular ischemic changes, demyelinating disease, or sequela of migraines. Electronically Signed   By: Lorenza Cambridge M.D.   On: 01/11/2023 20:07   MR ANGIO HEAD WO CONTRAST  Result Date: 01/11/2023 CLINICAL DATA:  Neuro deficit, acute, stroke suspected EXAM: MRI HEAD WITHOUT  CONTRAST MRA HEAD WITHOUT CONTRAST TECHNIQUE: Multiplanar, multi-echo pulse sequences of the brain and surrounding structures were acquired without intravenous contrast. Angiographic images of the Circle of Willis were acquired using MRA technique without intravenous contrast. COMPARISON:  Same day CT, MR Head 10/23/04. FINDINGS: MRI HEAD FINDINGS Brain: Negative for an acute infarct. No hemorrhage. No extra-axial fluid collection. No hydrocephalus. Few small scattered subcortical and periventricular T2/FLAIR hyperintense lesions are nonspecific, but progressed compared to 2006. Vascular: Normal flow voids.  See below Skull and  upper cervical spine: Normal marrow signal. Sinuses/Orbits: No middle ear or mastoid effusion. Paranasal sinuses are clear. Orbits are unremarkable. Other: None. MRA HEAD FINDINGS Anterior circulation: No aneurysm. No stenosis. No proximal occlusion. Posterior circulation: No aneurysm. No stenosis. No proximal occlusion. Anatomic variants: Fetal PCA on the right. IMPRESSION: 1. Negative for an acute infarct. 2. No intracranial large vessel occlusion or significant stenosis. 3. Few small scattered subcortical and periventricular T2/FLAIR hyperintense lesions are nonspecific, but progressed compared to 2006. Differential considerations include chronic microvascular ischemic changes, demyelinating disease, or sequela of migraines. Electronically Signed   By: Lorenza Cambridge M.D.   On: 01/11/2023 20:07   CT HEAD CODE STROKE WO CONTRAST  Result Date: 01/11/2023 CLINICAL DATA:  Code stroke. Neuro deficit, acute, stroke suspected. No lateralizing sinus available at the time of interpretation. EXAM: CT HEAD WITHOUT CONTRAST TECHNIQUE: Contiguous axial images were obtained from the base of the skull through the vertex without intravenous contrast. RADIATION DOSE REDUCTION: This exam was performed according to the departmental dose-optimization program which includes automated exposure control, adjustment of the mA and/or kV according to patient size and/or use of iterative reconstruction technique. COMPARISON:  CT Head 02/06/04 FINDINGS: Brain: Possible cerebral cortical based hypodensity in the right frontal lobe (series 3, image 20, 21). Early mineralization of the basal ganglia left. No evidence of hemorrhage, hydrocephalus, extra-axial collection or mass lesion/mass effect. Vascular: No hyperdense vessel or unexpected calcification. Skull: Normal.  Negative for fracture or focal lesion. Sinuses/Orbits: No acute finding. Other: None. ASPECTS [facility] Stroke Program Early CT Score): 10 IMPRESSION: No hemorrhage or CT  evidence of an acute cortical infarct. Possible subtle cortical based hypodensity in the right frontal lobe versus artifact. Findings were discussed with Dr. Amada Jupiter on 01/11/2023 at 7:14 p.m Electronically Signed   By: Lorenza Cambridge M.D.   On: 01/11/2023 19:26     PERTINENT LAB RESULTS: CBC: Recent Labs    01/11/23 1914 01/11/23 1921 01/12/23 0211  WBC 9.4  --  7.7  HGB 13.6 14.6 12.4  HCT 42.6 43.0 38.7  PLT 200  --  267   CMET CMP     Component Value Date/Time   NA 136 01/12/2023 0211   K 3.4 (L) 01/12/2023 0211   CL 105 01/12/2023 0211   CO2 22 01/12/2023 0211   GLUCOSE 92 01/12/2023 0211   BUN 12 01/12/2023 0211   CREATININE 0.73 01/12/2023 0211   CALCIUM 9.1 01/12/2023 0211   PROT 6.7 01/11/2023 1914   ALBUMIN 3.9 01/11/2023 1914   AST 20 01/11/2023 1914   ALT 21 01/11/2023 1914   ALKPHOS 87 01/11/2023 1914   BILITOT 0.6 01/11/2023 1914   GFRNONAA >60 01/12/2023 0211    GFR Estimated Creatinine Clearance: 113.7 mL/min (by C-G formula based on SCr of 0.73 mg/dL). No results for input(s): "LIPASE", "AMYLASE" in the last 72 hours. No results for input(s): "CKTOTAL", "CKMB", "CKMBINDEX", "TROPONINI" in the last 72 hours. Invalid input(s): "POCBNP" No results for input(s): "DDIMER" in the last 72 hours. No results for input(s): "HGBA1C" in the last 72 hours. No results for input(s): "CHOL", "HDL", "LDLCALC", "TRIG", "CHOLHDL", "LDLDIRECT" in the last 72 hours. No results for input(s): "TSH", "T4TOTAL", "T3FREE", "THYROIDAB" in the last 72 hours.  Invalid input(s): "FREET3" No results for input(s): "VITAMINB12", "FOLATE", "FERRITIN", "TIBC", "IRON", "RETICCTPCT" in the last 72 hours. Coags: Recent Labs    01/11/23 1914  INR 1.0   Microbiology: No results found for this or any previous visit (from the past 240 hour(s)).  FURTHER DISCHARGE INSTRUCTIONS:  Get Medicines reviewed and adjusted: Please take all your medications with you for your next visit  with your Primary MD  Laboratory/radiological data: Please request your Primary MD to go over all hospital tests and procedure/radiological results at the follow up, please ask your Primary MD to get all Hospital records sent to his/her office.  In some cases, they will be blood work, cultures and biopsy results pending at the time of your discharge. Please request that your primary care M.D. goes through all the records of your hospital data and follows up on these results.  Also Note the following: If you experience worsening of your admission symptoms, develop shortness of breath, life threatening emergency, suicidal or homicidal thoughts you must seek medical attention immediately by calling 911 or calling your MD immediately  if symptoms less severe.  You must read complete instructions/literature along with all the possible adverse reactions/side effects for all the Medicines you take and that have been prescribed to you. Take any new Medicines after you have completely understood and accpet all the possible adverse reactions/side effects.   Do not drive when taking Pain medications or sleeping medications (Benzodaizepines)  Do not take more than prescribed Pain, Sleep and Anxiety Medications. It is not advisable to combine anxiety,sleep and pain medications without talking with your primary care practitioner  Special Instructions: If you have smoked or chewed Tobacco  in the last  2 yrs please stop smoking, stop any regular Alcohol  and or any Recreational drug use.  Wear Seat belts while driving.  Please note: You were cared for by a hospitalist during your hospital stay. Once you are discharged, your primary care physician will handle any further medical issues. Please note that NO REFILLS for any discharge medications will be authorized once you are discharged, as it is imperative that you return to your primary care physician (or establish a relationship with a primary care physician  if you do not have one) for your post hospital discharge needs so that they can reassess your need for medications and monitor your lab values.  Total Time spent coordinating discharge including counseling, education and face to face time equals greater than 30 minutes.  SignedJeoffrey Massed 01/12/2023 8:33 AM

## 2023-01-12 NOTE — Procedures (Signed)
History: 52 yo F with AMS  Sedation: None  Technique: This EEG was acquired with electrodes placed according to the International 10-20 electrode system (including Fp1, Fp2, F3, F4, C3, C4, P3, P4, O1, O2, T3, T4, T5, T6, A1, A2, Fz, Cz, Pz). The following electrodes were missing or displaced: none.   Background: The background consists of intermixed alpha and beta activities. There is a well defined posterior dominant rhythm of 8-9 Hz that attenuates with eye opening. Sleep is recorded with normal appearing structures.   Photic stimulation: Physiologic driving is not performed  EEG Abnormalities: none  Clinical Interpretation: This normal EEG is recorded in the waking and sleep state. There was no seizure or seizure predisposition recorded on this study. Please note that lack of epileptiform activity on EEG does not preclude the possibility of epilepsy.   Ritta Slot, MD [facility] [phone]  If 7pm- 7am, please page neurology on call as listed in [facility].

## 2023-01-12 NOTE — ED Notes (Signed)
ED TO INPATIENT HANDOFF REPORT  ED Nurse Name and Phone #: Joselyn Glassman [phone])  S Name/Age/Gender Heidi Goodman 52 y.o. female Room/Bed: 002C/002C  Code Status   Code Status: Full Code  Home/SNF/Other Home Patient oriented to: self, place, time, and situation Is this baseline? Yes   Triage Complete: Triage complete  Chief Complaint Acute encephalopathy [G93.40]  Triage Note Per EMS report, pt was at home with friends and family when she stood up and had a sudden onset of dizziness, confusion, and aphasia. Upon arrival to ED, pt is not following commands and has overall weakness. Pt favoring the right side with her eyes, but will look to her left upon command. Has a hx of HTN and was put on a new BP med a week ago. The husband thinks it was lisinopril. Pt had an episode similar to tonight's episode about 3 weeks ago, but it was for a short time only and urgent care said it was her elevated BP.   Allergies Allergies  Allergen Reactions   Ivp Dye [Iodinated Contrast Media] Swelling   Shellfish Allergy Swelling    Level of Care/Admitting Diagnosis ED Disposition     ED Disposition  Admit   Condition  --   Comment  Hospital Area: [facility] [[id number]]  Level of Care: Telemetry Medical [104]  May place patient in observation at [facility] or [facility] if equivalent level of care is available:: No  Covid Evaluation: Asymptomatic - no recent exposure (last 10 days) testing not required  Diagnosis: Acute encephalopathy [191478]  Admitting Physician: Briscoe Deutscher [[id number]]  Attending Physician: Briscoe Deutscher [[id number]]          B Medical/Surgery History Past Medical History:  Diagnosis Date   Depression 11/12/2013   Hypertension 01/12/2023   OSA (obstructive sleep apnea) 11/20/2013   Past Surgical History:  Procedure Laterality Date   APPENDECTOMY     FOOT SURGERY       A IV Location/Drains/Wounds Patient Lines/Drains/Airways Status      Active Line/Drains/Airways     Name Placement date Placement time Site Days   Peripheral IV 01/11/23 18 G Left Antecubital 01/11/23  1900  Antecubital  1            Intake/Output Last 24 hours  Intake/Output Summary (Last 24 hours) at 01/12/2023 0048 Last data filed at 01/11/2023 2321 Gross per 24 hour  Intake 500 ml  Output --  Net 500 ml    Labs/Imaging Results for orders placed or performed during the hospital encounter of 01/11/23 (from the past 48 hour(s))  Protime-INR     Status: None   Collection Time: 01/11/23  7:14 PM  Result Value Ref Range   Prothrombin Time 13.2 11.4 - 15.2 seconds   INR 1.0 0.8 - 1.2    Comment: (NOTE) INR goal varies based on device and disease states. Performed at [facility], [address], [place], [place] [address]   APTT     Status: Abnormal   Collection Time: 01/11/23  7:14 PM  Result Value Ref Range   aPTT 20 (L) 24 - 36 seconds    Comment: Performed at [facility], [address] [place], [place] [address]  CBC     Status: None   Collection Time: 01/11/23  7:14 PM  Result Value Ref Range   WBC 9.4 4.0 - 10.5 K/uL   RBC 4.48 3.87 - 5.11 MIL/uL   Hemoglobin 13.6 12.0 -  15.0 g/dL   HCT 16.1 09.6 - 04.5 %   MCV 95.1 80.0 - 100.0 fL   MCH 30.4 26.0 - 34.0 pg   MCHC 31.9 30.0 - 36.0 g/dL   RDW 40.9 81.1 - 91.4 %   Platelets 200 150 - 400 K/uL    Comment: REPEATED TO VERIFY   nRBC 0.0 0.0 - 0.2 %    Comment: Performed at [facility], [address] [place], [place] [address]  Differential     Status: None   Collection Time: 01/11/23  7:14 PM  Result Value Ref Range   Neutrophils Relative % 54 %   Neutro Abs 5.1 1.7 - 7.7 K/uL   Lymphocytes Relative 37 %   Lymphs Abs 3.5 0.7 - 4.0 K/uL   Monocytes Relative 6 %   Monocytes Absolute 0.5 0.1 - 1.0 K/uL   Eosinophils Relative 2 %   Eosinophils Absolute 0.2 0.0 - 0.5 K/uL   Basophils Relative 1 %   Basophils Absolute 0.1 0.0 - 0.1 K/uL   Immature  Granulocytes 0 %   Abs Immature Granulocytes 0.03 0.00 - 0.07 K/uL    Comment: Performed at [facility], [address], [place], [place] [address]  Comprehensive metabolic panel     Status: Abnormal   Collection Time: 01/11/23  7:14 PM  Result Value Ref Range   Sodium 135 135 - 145 mmol/L   Potassium 4.0 3.5 - 5.1 mmol/L    Comment: HEMOLYSIS AT THIS LEVEL MAY AFFECT RESULT   Chloride 103 98 - 111 mmol/L   CO2 19 (L) 22 - 32 mmol/L   Glucose, Bld 94 70 - 99 mg/dL    Comment: Glucose reference range applies only to samples taken after fasting for at least 8 hours.   BUN 12 6 - 20 mg/dL   Creatinine, Ser 8.65 0.44 - 1.00 mg/dL   Calcium 9.3 8.9 - 78.4 mg/dL   Total Protein 6.7 6.5 - 8.1 g/dL   Albumin 3.9 3.5 - 5.0 g/dL   AST 20 15 - 41 U/L    Comment: HEMOLYSIS AT THIS LEVEL MAY AFFECT RESULT   ALT 21 0 - 44 U/L    Comment: HEMOLYSIS AT THIS LEVEL MAY AFFECT RESULT   Alkaline Phosphatase 87 38 - 126 U/L   Total Bilirubin 0.6 0.3 - 1.2 mg/dL    Comment: HEMOLYSIS AT THIS LEVEL MAY AFFECT RESULT   GFR, Estimated >60 >60 mL/min    Comment: (NOTE) Calculated using the CKD-EPI Creatinine Equation (2021)    Anion gap 13 5 - 15    Comment: Performed at [facility], [address], [place], [place] [address]  I-Stat beta hCG blood, ED     Status: None   Collection Time: 01/11/23  7:20 PM  Result Value Ref Range   I-stat hCG, quantitative <5.0 <5 mIU/mL   Comment 3            Comment:   GEST. AGE      CONC.  (mIU/mL)   <=1 WEEK        5 - 50     2 WEEKS       50 - 500     3 WEEKS       100 - 10,000     4 WEEKS     1,000 - 30,000        FEMALE AND NON-PREGNANT FEMALE:     LESS THAN 5 mIU/mL  I-stat chem 8, ED     Status: None   Collection Time: 01/11/23  7:21 PM  Result Value Ref Range   Sodium 139 135 - 145 mmol/L   Potassium 3.8 3.5 - 5.1 mmol/L   Chloride 104 98 - 111 mmol/L   BUN 13 6 - 20 mg/dL   Creatinine, Ser 2.95 0.44 - 1.00 mg/dL   Glucose, Bld 94 70  - 99 mg/dL    Comment: Glucose reference range applies only to samples taken after fasting for at least 8 hours.   Calcium, Ion 1.17 1.15 - 1.40 mmol/L   TCO2 26 22 - 32 mmol/L   Hemoglobin 14.6 12.0 - 15.0 g/dL   HCT 28.4 13.2 - 44.0 %  Rapid urine drug screen (hospital performed)     Status: None   Collection Time: 01/11/23  9:28 PM  Result Value Ref Range   Opiates NONE DETECTED NONE DETECTED   Cocaine NONE DETECTED NONE DETECTED   Benzodiazepines NONE DETECTED NONE DETECTED   Amphetamines NONE DETECTED NONE DETECTED   Tetrahydrocannabinol NONE DETECTED NONE DETECTED   Barbiturates NONE DETECTED NONE DETECTED    Comment: (NOTE) DRUG SCREEN FOR MEDICAL PURPOSES ONLY.  IF CONFIRMATION IS NEEDED FOR ANY PURPOSE, NOTIFY LAB WITHIN 5 DAYS.  LOWEST DETECTABLE LIMITS FOR URINE DRUG SCREEN Drug Class                     Cutoff (ng/mL) Amphetamine and metabolites    1000 Barbiturate and metabolites    200 Benzodiazepine                 200 Opiates and metabolites        300 Cocaine and metabolites        300 THC                            50 Performed at [facility], [address], [place], [place] [address]   Urinalysis, Routine w reflex microscopic -Urine, Clean Catch     Status: Abnormal   Collection Time: 01/11/23  9:28 PM  Result Value Ref Range   Color, Urine YELLOW YELLOW   APPearance HAZY (A) CLEAR   Specific Gravity, Urine 1.018 1.005 - 1.030   pH 6.0 5.0 - 8.0   Glucose, UA NEGATIVE NEGATIVE mg/dL   Hgb urine dipstick NEGATIVE NEGATIVE   Bilirubin Urine NEGATIVE NEGATIVE   Ketones, ur NEGATIVE NEGATIVE mg/dL   Protein, ur NEGATIVE NEGATIVE mg/dL   Nitrite NEGATIVE NEGATIVE   Leukocytes,Ua TRACE (A) NEGATIVE   RBC / HPF 0-5 0 - 5 RBC/hpf   WBC, UA 0-5 0 - 5 WBC/hpf   Bacteria, UA RARE (A) NONE SEEN   Squamous Epithelial / HPF 6-10 0 - 5 /HPF   Mucus PRESENT     Comment: Performed at [facility], [address] [place], [place] [address]  Ethanol      Status: None   Collection Time: 01/11/23  9:35 PM  Result Value Ref Range   Alcohol, Ethyl (B) <10 <10 mg/dL    Comment: (NOTE) Lowest detectable limit for serum alcohol is 10 mg/dL.  For medical purposes only. Performed at [facility], [address] [place], [place] [address]   Ammonia     Status: None   Collection Time: 01/11/23  9:35 PM  Result Value Ref Range   Ammonia 28 9 - 35 umol/L  Comment: Performed at [facility], [address] [place], [place] [address]   No results found.  Pending Labs Unresulted Labs (From admission, onward)     Start     Ordered   01/19/23 0500  Creatinine, serum  (enoxaparin (LOVENOX)    CrCl >/= 30 ml/min)  Weekly,   R     Comments: while on enoxaparin therapy    01/12/23 0044   01/12/23 0500  HIV Antibody (routine testing w rflx)  (HIV Antibody (Routine testing w reflex) panel)  Tomorrow morning,   R        01/12/23 0044   01/12/23 0500  Basic metabolic panel  Daily,   R      01/12/23 0044   01/12/23 0500  CBC  Daily,   R      01/12/23 0044            Vitals/Pain Today's Vitals   01/11/23 2115 01/11/23 2130 01/11/23 2145 01/11/23 2350  BP: (!) 138/92 134/81 126/70   Pulse: 76 74 70   Resp: 17 20 16    Temp:    98.1 F (36.7 C)  TempSrc:    Oral  SpO2: 99% 100% 100%   Weight:      Height:        Isolation Precautions No active isolations  Medications Medications  amLODipine (NORVASC) tablet 2.5 mg (has no administration in time range)  enoxaparin (LOVENOX) injection 60 mg (has no administration in time range)  sodium chloride flush (NS) 0.9 % injection 3 mL (has no administration in time range)  acetaminophen (TYLENOL) tablet 650 mg (has no administration in time range)    Or  acetaminophen (TYLENOL) suppository 650 mg (has no administration in time range)  senna-docusate (Senokot-S) tablet 1 tablet (has no administration in time range)  ondansetron (ZOFRAN) tablet 4 mg (has no administration in  time range)    Or  ondansetron (ZOFRAN) injection 4 mg (has no administration in time range)  sodium chloride flush (NS) 0.9 % injection 3 mL (3 mLs Intravenous Given 01/11/23 2142)  metoCLOPramide (REGLAN) injection 10 mg (10 mg Intravenous Given 01/11/23 2138)  diphenhydrAMINE (BENADRYL) injection 12.5 mg (12.5 mg Intravenous Given 01/11/23 2138)  lactated ringers bolus 500 mL (0 mLs Intravenous Stopped 01/11/23 2321)    Mobility walks     Focused Assessments Neuro Assessment Handoff:  Swallow screen pass? Yes    NIH Stroke Scale  Dizziness Present: No Headache Present: Yes Interval: Initial Level of Consciousness (1a.)   : Alert, keenly responsive LOC Questions (1b. )   : Answers both questions correctly LOC Commands (1c. )   : Performs both tasks correctly Best Gaze (2. )  : Normal Visual (3. )  : No visual loss Facial Palsy (4. )    : Normal symmetrical movements Motor Arm, Left (5a. )   : No drift Motor Arm, Right (5b. ) : No drift Motor Leg, Left (6a. )  : No drift Motor Leg, Right (6b. ) : No drift Limb Ataxia (7. ): Absent Sensory (8. )  : Normal, no sensory loss Best Language (9. )  : No aphasia Dysarthria (10. ): Normal Extinction/Inattention (11.)   : No Abnormality Complete NIHSS TOTAL: 0 Last date known well: 01/11/23 Last time known well: 1805 Neuro Assessment:   Neuro Checks:   Initial (01/11/23 1900)  Has TPA been given? No If patient is a Neuro Trauma and patient is going to OR before floor call report  to 4N Charge nurse: [phone] or [phone]   R Recommendations: See Admitting Provider Note  Report given to:   Additional Notes: Pt does not recall all events leading to hospital visit but is able to appropriately converse and answer questions, which is improvement from initial arrival.  NIHSS currently 0; pt able to move extremities freely.

## 2023-01-12 NOTE — ED Notes (Signed)
This RN to assume care of pt at this time.  Upon initial arrival, pt went straight to CT instead of ED room.  This RN to meet day shift RN and pt in CT to accompany pt to MRI.

## 2023-01-12 NOTE — ED Notes (Signed)
Pt assisted with bedpan for urinary needs.  Pt able to assist this RN and tech with turning.  Linens changed at this time as well.  Pt able to appropriately converse and answer questions.  Pt states that last thing she recalls is coming home from work but does not remember much else after that point.  States she does not remember going to CT or MRI upon arrival.

## 2023-01-12 NOTE — H&P (Signed)
History and Physical    Heidi Goodman ZOX:[id number] DOB: 1970-12-06 DOA: 01/11/2023  PCP: [facility]   Patient coming from: Home   Chief Complaint: Confusion, difficulty speaking, dizziness   HPI: Heidi Goodman is a 52 y.o. female with medical history significant for hypertension, depression, and OSA on CPAP who was in her usual state of health prior to acute onset of dizziness and confusion.  Her husband reports that the patient stood up, was "dizzy," and then confused and having difficulty speaking.  EMS was called and the patient was brought into the ED.  Patient remembers having a normal morning but does not recall the event that led to EMS being called.  She reports feeling fatigued, but denies headache, change in vision or hearing, or focal numbness or weakness.  She denies any recent illness or trauma.  ED Course: Upon arrival to the ED, patient is found to be afebrile and saturating well on room air with normal heart rate and stable blood pressure.  EKG demonstrates sinus rhythm with LVH.  MRI brain was negative for acute infarction but notable for a few small scattered hyperintense lesions that are nonspecific.  UDS and alcohol screen were negative.  Basic labs were essentially normal, ammonia was normal, and UA not suggestive of infection.  Patient was given 500 mL of LR, Benadryl, and Reglan in the ED.  She was evaluated by neurology in the emergency department.  Review of Systems:  All other systems reviewed and apart from HPI, are negative.  Past Medical History:  Diagnosis Date   Depression 11/12/2013   Hypertension 01/12/2023   OSA (obstructive sleep apnea) 11/20/2013    Past Surgical History:  Procedure Laterality Date   APPENDECTOMY     FOOT SURGERY      Social History:   reports that she has been smoking cigarettes. She has a 1.80 pack-year smoking history. She has never used smokeless tobacco. She reports that she does not currently use alcohol. She  reports that she does not use drugs.  Allergies  Allergen Reactions   Ivp Dye [Iodinated Contrast Media] Swelling   Shellfish Allergy Swelling    Family History  Problem Relation Age of Onset   Hypertension Mother    Hyperlipidemia Mother    Heart disease Mother 46       CAD, MI   Hypertension Maternal Grandmother      Prior to Admission medications   Medication Sig Start Date End Date Taking? Authorizing Provider  acetaminophen (TYLENOL) 500 MG tablet Take 1,000 mg by mouth every 6 (six) hours as needed for mild pain.    [provider]  amLODipine (NORVASC) 2.5 MG tablet Take 1 tablet (2.5 mg total) by mouth daily. 12/22/22   Wallis Bamberg, PA-C  cetirizine (ZYRTEC) 10 MG tablet Take 1 tablet (10 mg total) by mouth daily as needed for allergies or rhinitis. Patient not taking: Reported on 10/02/2015 04/17/14   Coralyn Helling, MD  dexlansoprazole (DEXILANT) 60 MG capsule Take 60 mg by mouth daily.    [provider]  Homeopathic Products (CHARCOCAPS HOMEOPATHIC PO) Take 1 capsule by mouth every 6 (six) hours as needed (gas).    [provider]  HYDROcodone-acetaminophen (NORCO) 5-325 MG tablet Take 1 tablet by mouth every 6 (six) hours as needed for severe pain. 09/27/15   Street, [place], PA-C  metoCLOPramide (REGLAN) 10 MG tablet Take 10 mg by mouth 3 (three) times daily before meals.    [provider]  metroNIDAZOLE (FLAGYL) 500 MG tablet Take 1 tablet (500 mg total) by mouth 2 (two) times daily. One po bid x 7 days 09/27/15   Street, [place], PA-C  omeprazole (PRILOSEC) 40 MG capsule Take 40 mg by mouth daily.    [provider]  ondansetron (ZOFRAN ODT) 8 MG disintegrating tablet Take 1 tablet (8 mg total) by mouth every 8 (eight) hours as needed for nausea or vomiting. 09/27/15   Street, [place], PA-C  polyethylene glycol (MIRALAX / GLYCOLAX) packet Take 17 g by mouth daily. Patient not taking: Reported on 08/09/2019 09/27/15   Street,  [place], PA-C  sucralfate (CARAFATE) 1 g tablet Take 1 g by mouth 2 (two) times daily.    [provider]  sulfamethoxazole-trimethoprim (BACTRIM DS,SEPTRA DS) 800-160 MG tablet Take 1 tablet by mouth 2 (two) times daily. 09/27/15   Street, [place], [place]    Physical Exam: Vitals:   01/12/23 0000 01/12/23 0200 01/12/23 0230 01/12/23 0400  BP:   125/69   Pulse: 63 69 63 63  Resp: 12 (!) 22 16 15   Temp:   98.2 F (36.8 C)   TempSrc:   Oral   SpO2: 98% 100% 100% 99%  Weight:      Height:        Constitutional: NAD, no pallor or diaphoresis  Eyes: PERTLA, lids and conjunctivae normal ENMT: Mucous membranes are moist. Posterior pharynx clear of any exudate or lesions.   Neck: supple, no masses  Respiratory: no wheezing, no crackles. No accessory muscle use.  Cardiovascular: S1 & S2 heard, regular rate and rhythm. No JVD. Abdomen: No distension, no tenderness, soft. Bowel sounds active.  Musculoskeletal: no clubbing / cyanosis. No joint deformity upper and lower extremities.   Skin: no significant rashes, lesions, ulcers. Warm, dry, well-perfused. Neurologic: CN 2-12 grossly intact. Sensation to light touch intact. Strength 5/5 in all 4 limbs. Alert and oriented to person, place, and situation.  Psychiatric: Calm. Cooperative.    Labs and Imaging on Admission: I have personally reviewed following labs and imaging studies  CBC: Recent Labs  Lab 01/11/23 1914 01/11/23 1921 01/12/23 0211  WBC 9.4  --  7.7  NEUTROABS 5.1  --   --   HGB 13.6 14.6 12.4  HCT 42.6 43.0 38.7  MCV 95.1  --  94.2  PLT 200  --  267   Basic Metabolic Panel: Recent Labs  Lab 01/11/23 1914 01/11/23 1921 01/12/23 0211  NA 135 139 136  K 4.0 3.8 3.4*  CL 103 104 105  CO2 19*  --  22  GLUCOSE 94 94 92  BUN 12 13 12   CREATININE 0.95 0.90 0.73  CALCIUM 9.3  --  9.1   GFR: Estimated Creatinine Clearance: 113.7 mL/min (by C-G formula based on SCr of 0.73 mg/dL). Liver Function  Tests: Recent Labs  Lab 01/11/23 1914  AST 20  ALT 21  ALKPHOS 87  BILITOT 0.6  PROT 6.7  ALBUMIN 3.9   No results for input(s): "LIPASE", "AMYLASE" in the last 168 hours. Recent Labs  Lab 01/11/23 2135  AMMONIA 28   Coagulation Profile: Recent Labs  Lab 01/11/23 1914  INR 1.0   Cardiac Enzymes: No results for input(s): "CKTOTAL", "CKMB", "CKMBINDEX", "TROPONINI" in the last 168 hours. BNP (last 3 results) No results for input(s): "PROBNP" in the last 8760 hours. HbA1C: No results for input(s): "HGBA1C" in the last 72 hours. CBG: No results for input(s): "GLUCAP" in the last 168 hours. Lipid Profile: No results  for input(s): "CHOL", "HDL", "LDLCALC", "TRIG", "CHOLHDL", "LDLDIRECT" in the last 72 hours. Thyroid Function Tests: No results for input(s): "TSH", "T4TOTAL", "FREET4", "T3FREE", "THYROIDAB" in the last 72 hours. Anemia Panel: No results for input(s): "VITAMINB12", "FOLATE", "FERRITIN", "TIBC", "IRON", "RETICCTPCT" in the last 72 hours. Urine analysis:    Component Value Date/Time   COLORURINE YELLOW 01/11/2023 2128   APPEARANCEUR HAZY (A) 01/11/2023 2128   LABSPEC 1.018 01/11/2023 2128   PHURINE 6.0 01/11/2023 2128   GLUCOSEU NEGATIVE 01/11/2023 2128   HGBUR NEGATIVE 01/11/2023 2128   BILIRUBINUR NEGATIVE 01/11/2023 2128   KETONESUR NEGATIVE 01/11/2023 2128   PROTEINUR NEGATIVE 01/11/2023 2128   NITRITE NEGATIVE 01/11/2023 2128   LEUKOCYTESUR TRACE (A) 01/11/2023 2128   Sepsis Labs: @LABRCNTIP (procalcitonin:4,lacticidven:4) )No results found for this or any previous visit (from the past 240 hour(s)).   Radiological Exams on Admission: EEG adult  Result Date: 01/12/2023 Rejeana Brock, MD     01/12/2023  1:13 AM History: 53 yo F with AMS Sedation: None Technique: This EEG was acquired with electrodes placed according to the International 10-20 electrode system (including Fp1, Fp2, F3, F4, C3, C4, P3, P4, O1, O2, T3, T4, T5, T6, A1, A2, Fz, Cz,  Pz). The following electrodes were missing or displaced: none. Background: The background consists of intermixed alpha and beta activities. There is a well defined posterior dominant rhythm of 8-9 Hz that attenuates with eye opening. Sleep is recorded with normal appearing structures. Photic stimulation: Physiologic driving is not performed EEG Abnormalities: none Clinical Interpretation: This normal EEG is recorded in the waking and sleep state. There was no seizure or seizure predisposition recorded on this study. Please note that lack of epileptiform activity on EEG does not preclude the possibility of epilepsy. Ritta Slot, MD [facility] [phone] If 7pm- 7am, please page neurology on call as listed in AMION.   MR ANGIO NECK WO CONTRAST  Result Date: 01/11/2023 CLINICAL DATA:  Acute neurologic deficit EXAM: MRA NECK WITHOUT CONTRAST TECHNIQUE: Angiographic images of the neck were acquired using MRA technique without intravenous contrast. Carotid stenosis measurements (when applicable) are obtained utilizing NASCET criteria, using the distal internal carotid diameter as the denominator. COMPARISON:  None Available. FINDINGS: Aortic arch: Normal branching pattern. Right carotid system: Normal Left carotid system: Normal Vertebral arteries: Codominant.  Normal. Other: None. IMPRESSION: Normal MRA of the neck. Electronically Signed   By: Deatra Robinson M.D.   On: 01/11/2023 21:31   MR BRAIN WO CONTRAST  Result Date: 01/11/2023 CLINICAL DATA:  Neuro deficit, acute, stroke suspected EXAM: MRI HEAD WITHOUT  CONTRAST MRA HEAD WITHOUT CONTRAST TECHNIQUE: Multiplanar, multi-echo pulse sequences of the brain and surrounding structures were acquired without intravenous contrast. Angiographic images of the Circle of Willis were acquired using MRA technique without intravenous contrast. COMPARISON:  Same day CT, MR Head 10/23/04. FINDINGS: MRI HEAD FINDINGS Brain: Negative for an acute infarct. No  hemorrhage. No extra-axial fluid collection. No hydrocephalus. Few small scattered subcortical and periventricular T2/FLAIR hyperintense lesions are nonspecific, but progressed compared to 2006. Vascular: Normal flow voids.  See below Skull and upper cervical spine: Normal marrow signal. Sinuses/Orbits: No middle ear or mastoid effusion. Paranasal sinuses are clear. Orbits are unremarkable. Other: None. MRA HEAD FINDINGS Anterior circulation: No aneurysm. No stenosis. No proximal occlusion. Posterior circulation: No aneurysm. No stenosis. No proximal occlusion. Anatomic variants: Fetal PCA on the right. IMPRESSION: 1. Negative for an acute infarct. 2. No intracranial large vessel occlusion or significant stenosis. 3. Few small scattered  subcortical and periventricular T2/FLAIR hyperintense lesions are nonspecific, but progressed compared to 2006. Differential considerations include chronic microvascular ischemic changes, demyelinating disease, or sequela of migraines. Electronically Signed   By: Lorenza Cambridge M.D.   On: 01/11/2023 20:07   MR ANGIO HEAD WO CONTRAST  Result Date: 01/11/2023 CLINICAL DATA:  Neuro deficit, acute, stroke suspected EXAM: MRI HEAD WITHOUT  CONTRAST MRA HEAD WITHOUT CONTRAST TECHNIQUE: Multiplanar, multi-echo pulse sequences of the brain and surrounding structures were acquired without intravenous contrast. Angiographic images of the Circle of Willis were acquired using MRA technique without intravenous contrast. COMPARISON:  Same day CT, MR Head 10/23/04. FINDINGS: MRI HEAD FINDINGS Brain: Negative for an acute infarct. No hemorrhage. No extra-axial fluid collection. No hydrocephalus. Few small scattered subcortical and periventricular T2/FLAIR hyperintense lesions are nonspecific, but progressed compared to 2006. Vascular: Normal flow voids.  See below Skull and upper cervical spine: Normal marrow signal. Sinuses/Orbits: No middle ear or mastoid effusion. Paranasal sinuses are clear.  Orbits are unremarkable. Other: None. MRA HEAD FINDINGS Anterior circulation: No aneurysm. No stenosis. No proximal occlusion. Posterior circulation: No aneurysm. No stenosis. No proximal occlusion. Anatomic variants: Fetal PCA on the right. IMPRESSION: 1. Negative for an acute infarct. 2. No intracranial large vessel occlusion or significant stenosis. 3. Few small scattered subcortical and periventricular T2/FLAIR hyperintense lesions are nonspecific, but progressed compared to 2006. Differential considerations include chronic microvascular ischemic changes, demyelinating disease, or sequela of migraines. Electronically Signed   By: Lorenza Cambridge M.D.   On: 01/11/2023 20:07   CT HEAD CODE STROKE WO CONTRAST  Result Date: 01/11/2023 CLINICAL DATA:  Code stroke. Neuro deficit, acute, stroke suspected. No lateralizing sinus available at the time of interpretation. EXAM: CT HEAD WITHOUT CONTRAST TECHNIQUE: Contiguous axial images were obtained from the base of the skull through the vertex without intravenous contrast. RADIATION DOSE REDUCTION: This exam was performed according to the departmental dose-optimization program which includes automated exposure control, adjustment of the mA and/or kV according to patient size and/or use of iterative reconstruction technique. COMPARISON:  CT Head 02/06/04 FINDINGS: Brain: Possible cerebral cortical based hypodensity in the right frontal lobe (series 3, image 20, 21). Early mineralization of the basal ganglia left. No evidence of hemorrhage, hydrocephalus, extra-axial collection or mass lesion/mass effect. Vascular: No hyperdense vessel or unexpected calcification. Skull: Normal. Negative for fracture or focal lesion. Sinuses/Orbits: No acute finding. Other: None. ASPECTS [facility] Stroke Program Early CT Score): 10 IMPRESSION: No hemorrhage or CT evidence of an acute cortical infarct. Possible subtle cortical based hypodensity in the right frontal lobe versus artifact.  Findings were discussed with Dr. Amada Jupiter on 01/11/2023 at 7:14 p.m Electronically Signed   By: Lorenza Cambridge M.D.   On: 01/11/2023 19:26    EKG: Independently reviewed. Sinus rhythm, LVH.   Assessment/Plan   1. Acute encephalopathy  - Ammonia and EEG normal, no acute infarct on MRI but a few small scattered non-specific hyperintense lesions noted  - She is improving in ED but unable to recall the event that led to her presentation and is not yet back to baseline    2. Hypertension  - Continue amlodipine   3. OSA  - CPAP qHS      DVT prophylaxis: Lovenox  Code Status: Full  Level of Care: Level of care: Telemetry Medical Family Communication: husband updated from ED  Disposition Plan:  Patient is from: home  Anticipated d/c is to: Home  Anticipated d/c date is: 6/6 or 01/13/23  Patient currently: Pending  improved/stable mental status  Consults called: Neurology  Admission status: Observation     Briscoe Deutscher, MD [facility]  01/12/2023, 4:21 AM

## 2023-10-27 ENCOUNTER — Emergency Department (HOSPITAL_BASED_OUTPATIENT_CLINIC_OR_DEPARTMENT_OTHER): Payer: Self-pay

## 2023-10-27 ENCOUNTER — Encounter (HOSPITAL_BASED_OUTPATIENT_CLINIC_OR_DEPARTMENT_OTHER): Payer: Self-pay | Admitting: Emergency Medicine

## 2023-10-27 ENCOUNTER — Ambulatory Visit
Admission: EM | Admit: 2023-10-27 | Discharge: 2023-10-27 | Disposition: A | Discharge status: Home / Self Care | Payer: Self-pay | Attending: Family Medicine | Admitting: Family Medicine

## 2023-10-27 ENCOUNTER — Emergency Department (HOSPITAL_BASED_OUTPATIENT_CLINIC_OR_DEPARTMENT_OTHER)
Admission: EM | Admit: 2023-10-27 | Discharge: 2023-10-27 | Disposition: A | Discharge status: Home / Self Care | Payer: Self-pay | Attending: Emergency Medicine | Admitting: Emergency Medicine

## 2023-10-27 ENCOUNTER — Other Ambulatory Visit: Payer: Self-pay

## 2023-10-27 DIAGNOSIS — M25511 Pain in right shoulder: Secondary | ICD-10-CM | POA: Diagnosis not present

## 2023-10-27 DIAGNOSIS — R42 Dizziness and giddiness: Secondary | ICD-10-CM

## 2023-10-27 DIAGNOSIS — Y92410 Unspecified street and highway as the place of occurrence of the external cause: Secondary | ICD-10-CM | POA: Diagnosis not present

## 2023-10-27 DIAGNOSIS — M542 Cervicalgia: Secondary | ICD-10-CM | POA: Diagnosis not present

## 2023-10-27 DIAGNOSIS — M545 Low back pain, unspecified: Secondary | ICD-10-CM | POA: Diagnosis not present

## 2023-10-27 DIAGNOSIS — I10 Essential (primary) hypertension: Secondary | ICD-10-CM | POA: Diagnosis not present

## 2023-10-27 DIAGNOSIS — R519 Headache, unspecified: Secondary | ICD-10-CM | POA: Insufficient documentation

## 2023-10-27 DIAGNOSIS — M25512 Pain in left shoulder: Secondary | ICD-10-CM | POA: Diagnosis not present

## 2023-10-27 DIAGNOSIS — S0990XA Unspecified injury of head, initial encounter: Secondary | ICD-10-CM

## 2023-10-27 MED ORDER — OXYCODONE-ACETAMINOPHEN 5-325 MG PO TABS
1.0000 | ORAL_TABLET | ORAL | Status: AC | PRN
  Administered 2023-10-27 (×2): 1 via ORAL
  Filled 2023-10-27 (×2): qty 1
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
## 2023-10-27 NOTE — Discharge Instructions (Signed)
 Please go to the ER for further evaluation of your symptoms
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
## 2023-11-20 ENCOUNTER — Other Ambulatory Visit: Payer: Self-pay | Admitting: Sports Medicine

## 2023-11-20 ENCOUNTER — Ambulatory Visit
Admission: RE | Admit: 2023-11-20 | Discharge: 2023-11-20 | Disposition: A | Discharge status: Home / Self Care | Source: Ambulatory Visit | Attending: Sports Medicine | Admitting: Sports Medicine

## 2023-11-20 DIAGNOSIS — M545 Low back pain, unspecified: Secondary | ICD-10-CM

## 2023-11-20 DIAGNOSIS — M542 Cervicalgia: Secondary | ICD-10-CM

## 2023-11-20 DIAGNOSIS — Z96698 Presence of other orthopedic joint implants: Secondary | ICD-10-CM

## 2023-11-20 DIAGNOSIS — M25572 Pain in left ankle and joints of left foot: Secondary | ICD-10-CM

## 2023-12-28 ENCOUNTER — Other Ambulatory Visit: Payer: Self-pay | Admitting: Sports Medicine

## 2023-12-28 DIAGNOSIS — M25572 Pain in left ankle and joints of left foot: Secondary | ICD-10-CM

## 2023-12-28 DIAGNOSIS — M4726 Other spondylosis with radiculopathy, lumbar region: Secondary | ICD-10-CM

## 2023-12-28 DIAGNOSIS — M545 Low back pain, unspecified: Secondary | ICD-10-CM

## 2024-01-31 ENCOUNTER — Ambulatory Visit
Admission: RE | Admit: 2024-01-31 | Discharge: 2024-01-31 | Discharge status: Home / Self Care | Source: Ambulatory Visit | Attending: Sports Medicine | Admitting: Sports Medicine

## 2024-01-31 DIAGNOSIS — M4726 Other spondylosis with radiculopathy, lumbar region: Secondary | ICD-10-CM

## 2024-01-31 DIAGNOSIS — M25572 Pain in left ankle and joints of left foot: Secondary | ICD-10-CM

## 2024-01-31 DIAGNOSIS — M545 Low back pain, unspecified: Secondary | ICD-10-CM

## 2024-02-29 ENCOUNTER — Other Ambulatory Visit: Payer: Self-pay | Admitting: Sports Medicine

## 2024-02-29 DIAGNOSIS — M79605 Pain in left leg: Secondary | ICD-10-CM

## 2024-02-29 DIAGNOSIS — M79662 Pain in left lower leg: Secondary | ICD-10-CM

## 2024-02-29 DIAGNOSIS — G90522 Complex regional pain syndrome I of left lower limb: Secondary | ICD-10-CM
